# Patient Record
Sex: Female | Born: 1960 | Race: White | Hispanic: No | Marital: Single | Smoking: Current every day smoker
Health system: Southern US, Community
[De-identification: ages and names within clinical notes are randomized; demographics above are authoritative.]

## PROBLEM LIST (undated history)

## (undated) DIAGNOSIS — I10 Essential (primary) hypertension: Secondary | ICD-10-CM

## (undated) DIAGNOSIS — M419 Scoliosis, unspecified: Secondary | ICD-10-CM

---

## 2001-11-15 ENCOUNTER — Emergency Department (HOSPITAL_COMMUNITY): Admission: EM | Admit: 2001-11-15 | Discharge: 2001-11-15 | Payer: Self-pay | Admitting: Emergency Medicine

## 2003-03-02 ENCOUNTER — Emergency Department (HOSPITAL_COMMUNITY): Admission: EM | Admit: 2003-03-02 | Discharge: 2003-03-02 | Payer: Self-pay | Admitting: Unknown Physician Specialty

## 2003-04-05 ENCOUNTER — Ambulatory Visit (HOSPITAL_BASED_OUTPATIENT_CLINIC_OR_DEPARTMENT_OTHER): Admission: RE | Admit: 2003-04-05 | Discharge: 2003-04-06 | Payer: Self-pay | Admitting: Orthopedic Surgery

## 2004-09-16 ENCOUNTER — Emergency Department (HOSPITAL_COMMUNITY): Admission: EM | Admit: 2004-09-16 | Discharge: 2004-09-16 | Payer: Self-pay | Admitting: Family Medicine

## 2004-11-16 ENCOUNTER — Ambulatory Visit: Payer: Self-pay | Admitting: Family Medicine

## 2004-11-17 ENCOUNTER — Ambulatory Visit: Payer: Self-pay | Admitting: *Deleted

## 2004-12-11 ENCOUNTER — Ambulatory Visit: Payer: Self-pay | Admitting: Family Medicine

## 2005-02-23 ENCOUNTER — Ambulatory Visit: Payer: Self-pay | Admitting: Family Medicine

## 2005-03-05 ENCOUNTER — Emergency Department (HOSPITAL_COMMUNITY): Admission: EM | Admit: 2005-03-05 | Discharge: 2005-03-05 | Payer: Self-pay | Admitting: Family Medicine

## 2005-03-24 ENCOUNTER — Emergency Department (HOSPITAL_COMMUNITY): Admission: EM | Admit: 2005-03-24 | Discharge: 2005-03-24 | Payer: Self-pay | Admitting: Family Medicine

## 2005-03-25 ENCOUNTER — Ambulatory Visit (HOSPITAL_COMMUNITY): Admission: RE | Admit: 2005-03-25 | Discharge: 2005-03-25 | Payer: Self-pay | Admitting: Family Medicine

## 2005-07-01 ENCOUNTER — Emergency Department (HOSPITAL_COMMUNITY): Admission: EM | Admit: 2005-07-01 | Discharge: 2005-07-01 | Payer: Self-pay | Admitting: Emergency Medicine

## 2005-07-29 ENCOUNTER — Ambulatory Visit: Payer: Self-pay | Admitting: Family Medicine

## 2005-10-25 ENCOUNTER — Ambulatory Visit: Payer: Self-pay | Admitting: Family Medicine

## 2005-12-27 ENCOUNTER — Ambulatory Visit: Payer: Self-pay | Admitting: Family Medicine

## 2006-04-04 ENCOUNTER — Ambulatory Visit: Payer: Self-pay | Admitting: Family Medicine

## 2006-08-23 ENCOUNTER — Ambulatory Visit: Payer: Self-pay | Admitting: Internal Medicine

## 2006-10-14 ENCOUNTER — Ambulatory Visit (HOSPITAL_COMMUNITY): Admission: RE | Admit: 2006-10-14 | Discharge: 2006-10-14 | Payer: Self-pay | Admitting: Family Medicine

## 2006-11-02 ENCOUNTER — Ambulatory Visit: Payer: Self-pay | Admitting: Family Medicine

## 2006-12-19 ENCOUNTER — Emergency Department (HOSPITAL_COMMUNITY): Admission: EM | Admit: 2006-12-19 | Discharge: 2006-12-19 | Payer: Self-pay | Admitting: Emergency Medicine

## 2006-12-26 ENCOUNTER — Ambulatory Visit: Payer: Self-pay | Admitting: Family Medicine

## 2007-01-10 ENCOUNTER — Encounter (INDEPENDENT_AMBULATORY_CARE_PROVIDER_SITE_OTHER): Payer: Self-pay | Admitting: *Deleted

## 2007-01-10 ENCOUNTER — Ambulatory Visit: Payer: Self-pay | Admitting: Family Medicine

## 2007-06-07 ENCOUNTER — Encounter (INDEPENDENT_AMBULATORY_CARE_PROVIDER_SITE_OTHER): Payer: Self-pay | Admitting: *Deleted

## 2007-07-24 ENCOUNTER — Ambulatory Visit: Payer: Self-pay | Admitting: Internal Medicine

## 2007-08-08 ENCOUNTER — Ambulatory Visit: Payer: Self-pay | Admitting: Internal Medicine

## 2007-09-05 ENCOUNTER — Ambulatory Visit: Payer: Self-pay | Admitting: Internal Medicine

## 2007-09-05 ENCOUNTER — Encounter (INDEPENDENT_AMBULATORY_CARE_PROVIDER_SITE_OTHER): Payer: Self-pay | Admitting: Family Medicine

## 2007-09-05 LAB — CONVERTED CEMR LAB
ALT: 23 units/L (ref 0–35)
AST: 20 units/L (ref 0–37)
Albumin: 4.4 g/dL (ref 3.5–5.2)
Alkaline Phosphatase: 73 units/L (ref 39–117)
BUN: 10 mg/dL (ref 6–23)
Basophils Absolute: 0 10*3/uL (ref 0.0–0.1)
Basophils Relative: 1 % (ref 0–1)
CO2: 23 meq/L (ref 19–32)
Calcium: 9 mg/dL (ref 8.4–10.5)
Chloride: 109 meq/L (ref 96–112)
Cholesterol: 214 mg/dL — ABNORMAL HIGH (ref 0–200)
Creatinine, Ser: 0.54 mg/dL (ref 0.40–1.20)
Eosinophils Absolute: 0.2 10*3/uL (ref 0.2–0.7)
Eosinophils Relative: 4 % (ref 0–5)
Glucose, Bld: 102 mg/dL — ABNORMAL HIGH (ref 70–99)
HCT: 44.3 % (ref 36.0–46.0)
HDL: 68 mg/dL (ref >39)
Hemoglobin: 15.4 g/dL — ABNORMAL HIGH (ref 12.0–15.0)
LDL Cholesterol: 129 mg/dL — ABNORMAL HIGH (ref 0–99)
Lymphocytes Relative: 29 % (ref 12–46)
Lymphs Abs: 1.6 10*3/uL (ref 0.7–4.0)
MCHC: 34.8 g/dL (ref 30.0–36.0)
MCV: 94.3 fL (ref 78.0–100.0)
Monocytes Absolute: 0.4 10*3/uL (ref 0.1–1.0)
Monocytes Relative: 6 % (ref 3–12)
Neutro Abs: 3.5 10*3/uL (ref 1.7–7.7)
Neutrophils Relative %: 61 % (ref 43–77)
Platelets: 236 10*3/uL (ref 150–400)
Potassium: 4.2 meq/L (ref 3.5–5.3)
RBC: 4.7 M/uL (ref 3.87–5.11)
RDW: 12.7 % (ref 11.5–15.5)
Sodium: 142 meq/L (ref 135–145)
Total Bilirubin: 0.5 mg/dL (ref 0.3–1.2)
Total CHOL/HDL Ratio: 3.1
Total Protein: 7.2 g/dL (ref 6.0–8.3)
Triglycerides: 87 mg/dL (ref <150)
VLDL: 17 mg/dL (ref 0–40)
WBC: 5.7 10*3/uL (ref 4.0–10.5)

## 2007-09-19 ENCOUNTER — Ambulatory Visit: Payer: Self-pay | Admitting: Family Medicine

## 2007-10-20 ENCOUNTER — Ambulatory Visit (HOSPITAL_COMMUNITY): Admission: RE | Admit: 2007-10-20 | Discharge: 2007-10-20 | Payer: Self-pay | Admitting: Family Medicine

## 2008-03-13 ENCOUNTER — Ambulatory Visit: Payer: Self-pay | Admitting: Family Medicine

## 2008-03-13 ENCOUNTER — Encounter: Payer: Self-pay | Admitting: Family Medicine

## 2008-03-13 LAB — CONVERTED CEMR LAB
Chlamydia, DNA Probe: NEGATIVE
GC Probe Amp, Genital: NEGATIVE

## 2008-04-11 ENCOUNTER — Ambulatory Visit (HOSPITAL_COMMUNITY): Admission: RE | Admit: 2008-04-11 | Discharge: 2008-04-11 | Payer: Self-pay | Admitting: Family Medicine

## 2008-04-24 ENCOUNTER — Ambulatory Visit: Payer: Self-pay | Admitting: Family Medicine

## 2008-04-24 LAB — CONVERTED CEMR LAB
BUN: 9 mg/dL (ref 6–23)
CO2: 24 meq/L (ref 19–32)
Calcium: 9.1 mg/dL (ref 8.4–10.5)
Chloride: 105 meq/L (ref 96–112)
Cholesterol: 222 mg/dL — ABNORMAL HIGH (ref 0–200)
Creatinine, Ser: 0.61 mg/dL (ref 0.40–1.20)
FSH: 63.3 milliintl units/mL
Glucose, Bld: 99 mg/dL (ref 70–99)
HDL: 71 mg/dL (ref >39)
LDL Cholesterol: 135 mg/dL — ABNORMAL HIGH (ref 0–99)
Potassium: 4.6 meq/L (ref 3.5–5.3)
Sodium: 139 meq/L (ref 135–145)
TSH: 1.481 microintl units/mL (ref 0.350–4.50)
Total CHOL/HDL Ratio: 3.1
Triglycerides: 81 mg/dL (ref <150)
VLDL: 16 mg/dL (ref 0–40)

## 2009-01-30 ENCOUNTER — Emergency Department (HOSPITAL_COMMUNITY): Admission: EM | Admit: 2009-01-30 | Discharge: 2009-01-30 | Payer: Self-pay | Admitting: Family Medicine

## 2009-03-04 ENCOUNTER — Emergency Department (HOSPITAL_COMMUNITY): Admission: EM | Admit: 2009-03-04 | Discharge: 2009-03-04 | Payer: Self-pay | Admitting: Emergency Medicine

## 2009-04-24 ENCOUNTER — Emergency Department (HOSPITAL_COMMUNITY): Admission: EM | Admit: 2009-04-24 | Discharge: 2009-04-24 | Payer: Self-pay | Admitting: Emergency Medicine

## 2009-11-06 ENCOUNTER — Emergency Department (HOSPITAL_COMMUNITY): Admission: EM | Admit: 2009-11-06 | Discharge: 2009-11-06 | Payer: Self-pay | Admitting: Emergency Medicine

## 2009-11-18 ENCOUNTER — Ambulatory Visit: Payer: Self-pay | Admitting: Internal Medicine

## 2009-12-03 ENCOUNTER — Ambulatory Visit: Payer: Self-pay | Admitting: Internal Medicine

## 2010-01-07 ENCOUNTER — Ambulatory Visit: Payer: Self-pay | Admitting: Internal Medicine

## 2010-01-07 LAB — CONVERTED CEMR LAB
BUN: 8 mg/dL (ref 6–23)
Basophils Absolute: 0 10*3/uL (ref 0.0–0.1)
Basophils Relative: 1 % (ref 0–1)
CO2: 25 meq/L (ref 19–32)
Calcium: 9.6 mg/dL (ref 8.4–10.5)
Chloride: 99 meq/L (ref 96–112)
Cholesterol: 185 mg/dL (ref 0–200)
Creatinine, Ser: 0.52 mg/dL (ref 0.40–1.20)
Eosinophils Absolute: 0.3 10*3/uL (ref 0.0–0.7)
Eosinophils Relative: 5 % (ref 0–5)
Glucose, Bld: 84 mg/dL (ref 70–99)
HCT: 46.4 % — ABNORMAL HIGH (ref 36.0–46.0)
HDL: 39 mg/dL — ABNORMAL LOW (ref >39)
Hemoglobin: 16.2 g/dL — ABNORMAL HIGH (ref 12.0–15.0)
LDL Cholesterol: 111 mg/dL — ABNORMAL HIGH (ref 0–99)
Lymphocytes Relative: 34 % (ref 12–46)
Lymphs Abs: 2.2 10*3/uL (ref 0.7–4.0)
MCHC: 34.9 g/dL (ref 30.0–36.0)
MCV: 89.4 fL (ref 78.0–100.0)
Monocytes Absolute: 0.3 10*3/uL (ref 0.1–1.0)
Monocytes Relative: 5 % (ref 3–12)
Neutro Abs: 3.6 10*3/uL (ref 1.7–7.7)
Neutrophils Relative %: 56 % (ref 43–77)
Platelets: 314 10*3/uL (ref 150–400)
Potassium: 4 meq/L (ref 3.5–5.3)
RBC: 5.19 M/uL — ABNORMAL HIGH (ref 3.87–5.11)
RDW: 13.7 % (ref 11.5–15.5)
Sodium: 136 meq/L (ref 135–145)
Total CHOL/HDL Ratio: 4.7
Triglycerides: 174 mg/dL — ABNORMAL HIGH (ref <150)
VLDL: 35 mg/dL (ref 0–40)
WBC: 6.4 10*3/uL (ref 4.0–10.5)

## 2010-01-14 ENCOUNTER — Ambulatory Visit: Payer: Self-pay | Admitting: Internal Medicine

## 2010-03-05 ENCOUNTER — Ambulatory Visit: Payer: Self-pay | Admitting: Internal Medicine

## 2010-03-05 LAB — CONVERTED CEMR LAB
Chlamydia, Swab/Urine, PCR: NEGATIVE
GC Probe Amp, Urine: NEGATIVE

## 2010-06-25 ENCOUNTER — Emergency Department (HOSPITAL_COMMUNITY): Admission: EM | Admit: 2010-06-25 | Discharge: 2010-06-25 | Payer: Self-pay | Admitting: Emergency Medicine

## 2010-06-25 ENCOUNTER — Ambulatory Visit (HOSPITAL_COMMUNITY): Admission: RE | Admit: 2010-06-25 | Discharge: 2010-06-25 | Payer: Self-pay | Admitting: General Practice

## 2010-06-25 ENCOUNTER — Ambulatory Visit: Payer: Self-pay | Admitting: Surgery

## 2011-02-05 NOTE — Op Note (Signed)
NAME:  Cathy James, Cathy James                            ACCOUNT NO.:  [id number]   MEDICAL RECORD NO.:  [id number]                   PATIENT TYPE:  AMB   LOCATION:  DSC                                  FACILITY:  [facility]   PHYSICIAN:  Feliberto Gottron. Turner Daniels, M.D.                DATE OF BIRTH:  08/19/1961   DATE OF PROCEDURE:  04/05/2003  DATE OF DISCHARGE:  04/05/2003                                 OPERATIVE REPORT   PREOPERATIVE DIAGNOSES:  Anterior cruciate ligament tear and medial meniscal  tear of the left knee.   POSTOPERATIVE DIAGNOSES:  Anterior cruciate ligament tear and medial  meniscal tear of the left knee.   PROCEDURE:  Left knee arthroscopic partial medial meniscectomy with the  removal of the bucket handle tear of the medial meniscus, and an anterior  cruciate ligament tear reconstruction using a 10 mm wide bone-tendon-bone  allograft 8 x 20 femoral screw, 9 x 20 tibial screw, titanium screws from  [employer], profile type.   SURGEON:  Feliberto Gottron. Turner Daniels, M.D.   ASSISTANT:  Holly Bodily, P.A. student.   ANESTHESIA:  General, LMA.   ESTIMATED BLOOD LOSS:  <Minimal.   FLUIDS REPLACED:  1 L of crystalloid.   DRAINS:  None.   TOURNIQUET TIME:  None.   INDICATIONS FOR PROCEDURE:  The patient is a 50 year old woman who sustained  an ACL tear, a medial meniscal tear last year.  We treated her  conservatively with physical therapy and anti-inflammatory medicines;  unfortunately she continues to have buckling and pain.  This has gotten  worse recently, so she is taken for an ACL reconstruction, having failed  conservative measures, and a partial medial meniscectomy.   DESCRIPTION OF PROCEDURE:  The patient was identified by arm band and was  taken to the operating room at [facility]. [facility].  Appropriate anesthetic monitors were attached.  General LMA  anesthesia was induced with the patient in the supine position.  The  tourniquet was applied high on  the left thigh and never used.  The lateral  __________  foot position are applied to the table, and the left lower  extremity was prepped and draped in the usual sterile fashion from the ankle  to the tourniquet.  A standard inferomedial and inferolateral peri-patellar  portals were then made with a #11 blade, allowing introduction of the  arthroscope through the inferolateral portal and the outflow through the  inferomedial portal.  The suprapatellar pouch and patella were in excellent  condition.  On the medial side the patient had a displaced bucket handle  tear of the medial meniscus, and this was removed piece-meal with a #11  blade, the arthroscopic biters and a 4.2 great white sucker shaver.  In a  notch she had an ACL tear that looked to be chronic.  The stumps were  removed.  There  was a bear lateral wall.  The standard superolateral notch-  plasty was performed with a 4.5 hooded vortex bur.  The lateral compartment  was in excellent condition, and the meniscus was probed.  The knee was then  placed in the foot position,  at 45 degrees of flexion.  The [employer] tibial  pin placement guide set at 55 degrees.  A 2.0 cm incision was made 1.0 cm  medial to and just distal to the tibial tubercle.  The tibial pin placement  guide was set on the posterior aspect of the ACL foot print insertion.  A  pin was driven through the tibia 1.0 cm distal to medial to the tibial  tubercle, and up to the posterior aspect of the ACL foot print.  This was  over-reamed with a 10 mm Badger reamer with the knee held at 45 degrees.  We  then reamed the femoral tunnel using a 7 mm over-the-top guide for pin  placement, followed by the 10 mm reamer to a depth of about 35 mm.  Satisfied with the placement of the tunnels, a superior notch was placed in  the femoral tunnel and a lateral notch in the tibial tunnel.  On the back  table the allograft was prepared and was set at 10 mm wide bone-tendon-bone  graft.   A #20 gauge wire was placed through the tibial bone plug.  The graft  was then threaded through the tibial tunnel into the femoral tunnel and the  knee hyperflexed.  A supplemental inferomedial portal was made, allowing the  placement of an 8 x 20 [employer] titanium Propel screw, fixing the femoral  bone plug firmly in the tunnel.  The knee was taken through a range of  motion.  Isometry to within 1 mm was noted.  The knee was set at 45 degrees  of flexion.  Reverse Lachman's maneuver applied.  The graft itself was  twisted 90 degrees clockwise, and a 9 x 20 Propel screw was used to fix the  tibial bone plug in the tibial tunnel with traction applied to the graft.  The knee was then taken through a range of motion to confirm no impingement  of the graft in full extension and flexion, and good tension.  The #20 gauge  wire was then clipped and removed.  The knee was once again washed out with  normal saline solution and then the arthroscopic instruments were removed.  The wound for placement of the graft 2 cm in length was closed with running  #2-0 Vicryl sutures and running #3-0 nylon sutures.  The portals themselves  were left open.  A dressing of Xeroform, 4 x 4 dressings, sponges, Webril  and an Ace wrap applied.  The patient was then awakened and taken to the  recovery room without difficulty.                                                Feliberto Gottron. Turner Daniels, M.D.    Ovid Curd  D:  04/05/2003  T:  04/07/2003  Job:  [id number]

## 2011-04-20 ENCOUNTER — Inpatient Hospital Stay (INDEPENDENT_AMBULATORY_CARE_PROVIDER_SITE_OTHER)
Admission: RE | Admit: 2011-04-20 | Discharge: 2011-04-20 | Disposition: A | Discharge status: Home / Self Care | Payer: Self-pay | Source: Ambulatory Visit | Attending: Family Medicine | Admitting: Family Medicine

## 2011-04-20 DIAGNOSIS — R51 Headache: Secondary | ICD-10-CM

## 2011-04-20 DIAGNOSIS — R112 Nausea with vomiting, unspecified: Secondary | ICD-10-CM

## 2011-04-20 DIAGNOSIS — F411 Generalized anxiety disorder: Secondary | ICD-10-CM

## 2011-11-05 ENCOUNTER — Encounter (HOSPITAL_COMMUNITY): Payer: Self-pay | Admitting: Emergency Medicine

## 2011-11-05 ENCOUNTER — Emergency Department (INDEPENDENT_AMBULATORY_CARE_PROVIDER_SITE_OTHER)
Admission: EM | Admit: 2011-11-05 | Discharge: 2011-11-05 | Disposition: A | Discharge status: Home / Self Care | Payer: Self-pay | Source: Home / Self Care | Attending: Family Medicine | Admitting: Family Medicine

## 2011-11-05 DIAGNOSIS — A09 Infectious gastroenteritis and colitis, unspecified: Secondary | ICD-10-CM

## 2011-11-05 DIAGNOSIS — A084 Viral intestinal infection, unspecified: Secondary | ICD-10-CM

## 2011-11-05 HISTORY — DX: Essential (primary) hypertension: I10

## 2011-11-05 MED ORDER — ONDANSETRON 4 MG PO TBDP
4.0000 mg | ORAL_TABLET | Freq: Once | ORAL | Status: AC
  Administered 2011-11-05: 4 mg via ORAL

## 2011-11-05 MED ORDER — ONDANSETRON 4 MG PO TBDP
ORAL_TABLET | ORAL | Status: AC
  Filled 2011-11-05: qty 1

## 2011-11-05 MED ORDER — ONDANSETRON HCL 4 MG PO TABS: 4.0000 mg | ORAL_TABLET | Freq: Four times a day (QID) | ORAL | Status: AC

## 2011-11-05 NOTE — ED Provider Notes (Signed)
History     CSN: [id number]  Arrival date & time 11/05/11  1057   First MD Initiated Contact with Patient 11/05/11 1128      Chief Complaint  Patient presents with  . GI Problem  . URI    (Consider location/radiation/quality/duration/timing/severity/associated sxs/prior treatment) Patient is a 51 y.o. female presenting with GI illlness and URI. The history is provided by the patient.  GI Problem  This is a new problem. The current episode started 6 to 12 hours ago (n/v/d acute onset, similar sx in co-workers.). The problem occurs 2 to 4 times per day. The problem has been gradually improving. The stool consistency is described as watery. There has been no fever. Associated symptoms include vomiting, myalgias and URI. Risk factors include ill contacts.  URI The primary symptoms include nausea, vomiting and myalgias.    Past Medical History  Diagnosis Date  . Hypertension     History reviewed. No pertinent past surgical history.  No family history on file.  History  Substance Use Topics  . Smoking status: Current Everyday Smoker  . Smokeless tobacco: Not on file  . Alcohol Use: No    OB History    Grav Para Term Preterm Abortions TAB SAB Ect Mult Living                  Review of Systems  Constitutional: Negative.   Gastrointestinal: Positive for nausea, vomiting and diarrhea.  Musculoskeletal: Positive for myalgias.    Allergies  Review of patient's allergies indicates no known allergies.  Home Medications   Current Outpatient Rx  Name Route Sig Dispense Refill  . ONDANSETRON HCL 4 MG PO TABS Oral Take 1 tablet (4 mg total) by mouth every 6 (six) hours. 6 tablet 0    BP 145/90  Pulse 78  Temp(Src) 98.5 F (36.9 C) (Oral)  Resp 14  SpO2 98%  Physical Exam  Nursing note and vitals reviewed. Constitutional: She is oriented to person, place, and time. She appears well-developed and well-nourished.  HENT:  Head: Normocephalic.  Mouth/Throat:  Oropharynx is clear and moist.  Eyes: Pupils are equal, round, and reactive to light.  Neck: Normal range of motion. Neck supple.  Abdominal: Soft. Bowel sounds are normal. She exhibits no distension and no mass. There is no tenderness. There is no rebound and no guarding.  Lymphadenopathy:    She has no cervical adenopathy.  Neurological: She is alert and oriented to person, place, and time.  Skin: Skin is warm and dry.    ED Course  Procedures (including critical care time)  Labs Reviewed - No data to display No results found.   1. Gastroenteritis and colitis, viral       MDM         Barkley Bruns, MD 11/05/11 1201

## 2011-11-05 NOTE — ED Notes (Signed)
HERE WITH SUDDEN ONSET N/V/D THAT STARTED LAST NIGHT.LAST EMESIS THIS AM AND NOW HAVING BODY ACHES AND CHILLS.

## 2012-11-13 ENCOUNTER — Encounter (HOSPITAL_COMMUNITY): Payer: Self-pay

## 2012-11-13 ENCOUNTER — Emergency Department (INDEPENDENT_AMBULATORY_CARE_PROVIDER_SITE_OTHER)
Admission: EM | Admit: 2012-11-13 | Discharge: 2012-11-13 | Disposition: A | Discharge status: Home / Self Care | Payer: Self-pay | Source: Home / Self Care | Attending: Emergency Medicine | Admitting: Emergency Medicine

## 2012-11-13 HISTORY — DX: Scoliosis, unspecified: M41.9

## 2012-11-13 MED ORDER — MELOXICAM 7.5 MG PO TABS: 7.5000 mg | ORAL_TABLET | Freq: Every day | ORAL | Status: DC

## 2012-11-13 NOTE — ED Notes (Signed)
No known injury; has been having issues with her right arm for several months; pain , episodic numbness; reportedly has been to see MD about her back (scolosis ) and has been using left over hydrocodone for her syx as needed; works as Advertising copywriter

## 2012-11-13 NOTE — ED Provider Notes (Signed)
History     CSN: [id number]  Arrival date & time 11/13/12  1000   First MD Initiated Contact with Patient 11/13/12 1015      Chief Complaint  Patient presents with  . Arm Pain    (Consider location/radiation/quality/duration/timing/severity/associated sxs/prior treatment) HPI Comments: Patient presents urgent care this morning describing the for several months she's been having right upper arm pain that shoots down sometimes reaching to move her fingers and with intermittent tingling and numbness sensation location. Denies any neck pain or upper back pain. Denies any recent or remote trauma or injuries. 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 (Patient points to anterior aspect of right upper arm bicipital region). No redness, no chills or fevers, no soft tissue swelling. She has taken some leftovers hydrocodone have been prescribed to her for scoliosis or back pain.  Patient is a 52 y.o. female presenting with arm pain. The history is provided by the patient.  Arm Pain This is a recurrent problem. The current episode started more than 1 week ago. The problem occurs constantly. The problem has been gradually worsening. Nothing aggravates the symptoms. Nothing relieves the symptoms. Treatments tried: Hydrocodone. The treatment provided moderate relief.    Past Medical History  Diagnosis Date  . Hypertension   . Scoliosis     History reviewed. No pertinent past surgical history.  History reviewed. No pertinent family history.  History  Substance Use Topics  . Smoking status: Current Every Day Smoker  . Smokeless tobacco: Not on file  . Alcohol Use: No    OB History   Grav Para Term Preterm Abortions TAB SAB Ect Mult Living                  Review of Systems  Constitutional: Positive for activity change. Negative for fever, diaphoresis, appetite change, fatigue and unexpected weight change.  Musculoskeletal:  Negative for myalgias.  Skin: Negative for color change, pallor, rash and wound.  Neurological: Negative for weakness and numbness.    Allergies  Review of patient's allergies indicates no known allergies.  Home Medications   Current Outpatient Rx  Name  Route  Sig  Dispense  Refill  . meloxicam (MOBIC) 7.5 MG tablet   Oral   Take 1 tablet (7.5 mg total) by mouth daily. Take one tablet daily for 2 weeks   14 tablet   0     BP 130/89  Pulse 83  Temp(Src) 98.1 F (36.7 C) (Oral)  Resp 18  SpO2 98%  Physical Exam  Nursing note and vitals reviewed. Constitutional: She appears well-developed and well-nourished.  Neck: Normal range of motion. Neck supple.  Musculoskeletal: She exhibits tenderness. She exhibits no edema.       Right upper arm: She exhibits tenderness. She exhibits no bony tenderness, no swelling, no edema, no deformity and no laceration.       Arms: Neurological: She is alert.  Skin: No rash noted. No erythema.    ED Course  Procedures (including critical care time)  Labs Reviewed - No data to display No results found.   1. Biceps tendinitis on right       MDM  Symptoms and physical exam consistent with bicipital tendinitis. Patient has been encouraged to use an Ace wrap or a total sleep to be used in this biceps region. Have, prescribe patient and meloxicam course for 2 weeks encouraged her to use some degree of compression to the area  to follow orthopedic provider if pain was to persist beyond 2 weeks.        Jimmie Molly, MD 11/13/12 [phone]

## 2012-11-25 ENCOUNTER — Encounter (HOSPITAL_COMMUNITY): Payer: Self-pay | Admitting: Emergency Medicine

## 2012-11-25 ENCOUNTER — Emergency Department (HOSPITAL_COMMUNITY)
Admission: EM | Admit: 2012-11-25 | Discharge: 2012-11-25 | Disposition: A | Discharge status: Home / Self Care | Payer: Self-pay | Source: Home / Self Care

## 2012-11-25 DIAGNOSIS — W19XXXA Unspecified fall, initial encounter: Secondary | ICD-10-CM

## 2012-11-25 DIAGNOSIS — S300XXA Contusion of lower back and pelvis, initial encounter: Secondary | ICD-10-CM

## 2012-11-25 NOTE — ED Notes (Signed)
Reports falling yesterday morning, slipped on ic .  Reports back pain, not as bad as it was yesterday.  Has been using heating pad and ibuprofen

## 2012-11-25 NOTE — ED Provider Notes (Signed)
History     CSN: [id number]  Arrival date & time 11/25/12  1102   None     Chief Complaint  Patient presents with  . 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 She experienced mild discomfort in most but one of the employees saw her fall and insisted that she be checked out prior to returning to work at 100%. The patient states she has no other injuries and that she only has mild soreness in the right buttock. She denies limitations with ambulation or any other movements. Denies injury to the head, neck, chest, back, abdomen, or lower extremities.   Past Medical History  Diagnosis Date  . Hypertension   . Scoliosis     History reviewed. No pertinent past surgical history.  No family history on file.  History  Substance Use Topics  . Smoking status: Current Every Day Smoker  . Smokeless tobacco: Not on file  . Alcohol Use: No    OB History   Grav Para Term Preterm Abortions TAB SAB Ect Mult Living                  Review of Systems  Constitutional: Negative for fever, chills and activity change.  HENT: Negative.   Respiratory: Negative.   Cardiovascular: Negative.   Musculoskeletal:       As per HPI  Skin: Negative for color change, pallor and rash.  Neurological: Negative.     Allergies  Review of patient's allergies indicates no known allergies.  Home Medications   Current Outpatient Rx  Name  Route  Sig  Dispense  Refill  . ibuprofen (ADVIL,MOTRIN) 200 MG tablet   Oral   Take 200 mg by mouth every 6 (six) hours as needed for pain.         . meloxicam (MOBIC) 7.5 MG tablet   Oral   Take 1 tablet (7.5 mg total) by mouth daily. Take one tablet daily for 2 weeks   14 tablet   0     BP 144/92  Pulse 86  Temp(Src) 98.7 F (37.1 C) (Oral)  Resp 20  SpO2 97%  Physical Exam   Constitutional: She is oriented to person, place, and time. She appears well-developed and well-nourished. No distress.  HENT:  Head: Normocephalic and atraumatic.  Eyes: EOM are normal. Pupils are equal, round, and reactive to light.  Neck: Normal range of motion. Neck supple.  Cardiovascular: Normal rate.   Pulmonary/Chest: Effort normal.  Abdominal: Soft.  Musculoskeletal:  Patient demonstrates full range of motion of the lower extremities. There is no bony tenderness or asymmetry. She is able to squat completely and then lift herself back up. Minimal tenderness to the right posterior buttock. No swelling. The gait is normal number no limitations in her movements. The back is nontender and nonpainful. She is able to bend forward beyond 90 without restrictions the   Lymphadenopathy:    She has no cervical adenopathy.  Neurological: She is alert and oriented to person, place, and time. No cranial nerve deficit.  Skin: Skin is warm and dry.  Psychiatric: She has a normal mood and affect.    ED Course  Procedures (including critical care time)  Labs Reviewed - No data to display No results found.   1. Contusion, buttock, initial encounter   2. Fall       MDM  Patient has no signs  of injury at this time. There is minor soreness to the right posterior buttock. She may return to work in 2 days at 100% capacity. In the meantime she may apply ice to the buttock for any soreness or intake Aleve or ibuprofen if needed.        Hayden Rasmussen, NP 11/25/12 [phone]

## 2012-11-26 NOTE — ED Provider Notes (Signed)
  I have directly reviewed the clinical findings, lab, imaging studies and management of this patient in detail. I have interviewed and examined the patient and agree with the documentation,  as recorded by the Physician extender.  Leroy Sea M.D on 11/26/2012 at 12:56 PM  [facility] Office  [phone]

## 2015-03-12 ENCOUNTER — Ambulatory Visit: Payer: Self-pay | Attending: Internal Medicine

## 2015-04-07 ENCOUNTER — Encounter: Payer: Self-pay | Admitting: Family Medicine

## 2015-04-07 ENCOUNTER — Ambulatory Visit: Payer: Self-pay | Attending: Family Medicine | Admitting: Family Medicine

## 2015-04-07 VITALS — BP 134/79 | HR 78 | Temp 99.1°F | Resp 16 | Ht 62.0 in | Wt 170.0 lb

## 2015-04-07 DIAGNOSIS — F17200 Nicotine dependence, unspecified, uncomplicated: Secondary | ICD-10-CM | POA: Insufficient documentation

## 2015-04-07 DIAGNOSIS — M25512 Pain in left shoulder: Secondary | ICD-10-CM

## 2015-04-07 DIAGNOSIS — G8929 Other chronic pain: Secondary | ICD-10-CM | POA: Insufficient documentation

## 2015-04-07 DIAGNOSIS — F329 Major depressive disorder, single episode, unspecified: Secondary | ICD-10-CM

## 2015-04-07 DIAGNOSIS — I10 Essential (primary) hypertension: Secondary | ICD-10-CM

## 2015-04-07 DIAGNOSIS — M545 Low back pain, unspecified: Secondary | ICD-10-CM | POA: Insufficient documentation

## 2015-04-07 DIAGNOSIS — M25511 Pain in right shoulder: Secondary | ICD-10-CM

## 2015-04-07 DIAGNOSIS — F32A Depression, unspecified: Secondary | ICD-10-CM

## 2015-04-07 DIAGNOSIS — E119 Type 2 diabetes mellitus without complications: Secondary | ICD-10-CM | POA: Insufficient documentation

## 2015-04-07 DIAGNOSIS — Z0000 Encounter for general adult medical examination without abnormal findings: Secondary | ICD-10-CM

## 2015-04-07 LAB — COMPLETE METABOLIC PANEL WITH GFR
ALT: 13 U/L (ref 0–35)
AST: 19 U/L (ref 0–37)
Albumin: 3.8 g/dL (ref 3.5–5.2)
Alkaline Phosphatase: 82 U/L (ref 39–117)
BILIRUBIN TOTAL: 0.4 mg/dL (ref 0.2–1.2)
BUN: 10 mg/dL (ref 6–23)
CO2: 30 mEq/L (ref 19–32)
Calcium: 10 mg/dL (ref 8.4–10.5)
Chloride: 104 mEq/L (ref 96–112)
Creat: 0.53 mg/dL (ref 0.50–1.10)
GFR, Est African American: >89 mL/min
GLUCOSE: 96 mg/dL (ref 70–99)
Potassium: 4.4 mEq/L (ref 3.5–5.3)
Sodium: 142 mEq/L (ref 135–145)
Total Protein: 6.5 g/dL (ref 6.0–8.3)

## 2015-04-07 LAB — CBC
HCT: 41.1 % (ref 36.0–46.0)
Hemoglobin: 14.1 g/dL (ref 12.0–15.0)
MCH: 30.9 pg (ref 26.0–34.0)
MCHC: 34.3 g/dL (ref 30.0–36.0)
MCV: 89.9 fL (ref 78.0–100.0)
MPV: 11.3 fL (ref 8.6–12.4)
Platelets: 259 10*3/uL (ref 150–400)
RBC: 4.57 MIL/uL (ref 3.87–5.11)
RDW: 13.8 % (ref 11.5–15.5)
WBC: 7.9 10*3/uL (ref 4.0–10.5)

## 2015-04-07 LAB — VITAMIN B12: Vitamin B-12: 1324 pg/mL — ABNORMAL HIGH (ref 211–911)

## 2015-04-07 MED ORDER — PAROXETINE HCL 20 MG PO TABS: 20.0000 mg | ORAL_TABLET | Freq: Every day | ORAL | Status: DC

## 2015-04-07 MED ORDER — NABUMETONE 500 MG PO TABS: 500.0000 mg | ORAL_TABLET | Freq: Every day | ORAL | Status: DC

## 2015-04-07 MED ORDER — ACETAMINOPHEN-CODEINE #3 300-30 MG PO TABS: 1.0000 | ORAL_TABLET | Freq: Three times a day (TID) | ORAL | Status: DC | PRN

## 2015-04-07 MED ORDER — HYDROCHLOROTHIAZIDE 12.5 MG PO CAPS: 12.5000 mg | ORAL_CAPSULE | Freq: Every day | ORAL | Status: DC

## 2015-04-07 NOTE — Progress Notes (Signed)
Establish care Complaining of shoulder pain, tingling on lt shoulder arm and hand  Hip and back pain  Hx of back surgery  Ht Tobacco less then one pack per day

## 2015-04-07 NOTE — Assessment & Plan Note (Signed)
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

## 2015-04-07 NOTE — Progress Notes (Signed)
   Subjective:    Patient ID: Cathy James, female    DOB: 01/30/1961, 54 y.o.   MRN: [id number] CC: establish care, chronic low back pain, b/l shoulder pain with radiculopathy down L arm  HPI 54 yo F presents to establish care and discuss the following:  1. Chronic low back pain: since 2005. 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  Had low back surgery in [place] in 11/2013, L4 and L5. Has been released fromr care. Has returned to work last month. Doing on laundry. Has low back pain that radiates to R lateral gluteal pain and sensation of swelling and hurts. Pain radiates down anterior thigh to knee.   2. Shoulder pain: b/l. Since 12/2014.  Worse on L side with tingling down L arm to hand. Has associated popping. Rubbing icy hot twice daily. Superior shoulder pain. Radiates medially toward neck and down cervical neck. 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   Soc Hx: current smoker  Med Hx: HTN and DM2 Surg Hx: low back surgery  Review of Systems  Constitutional: Negative for fever and chills.  Respiratory: Negative for shortness of breath.   Cardiovascular: Negative for chest pain.  Gastrointestinal: Negative for abdominal pain and blood in stool.  Musculoskeletal: Positive for back pain, arthralgias and neck pain.  Skin: Negative for rash.  Psychiatric/Behavioral: Negative for suicidal ideas and dysphoric mood.     GAD-7: score of 20. 3s to all except 2-7.     Objective:   Physical Exam BP 134/79 mmHg  Pulse 78  Temp(Src) 99.1 F (37.3 C) (Oral)  Resp 16  Ht 5\' 2"  (1.575 m)  Wt 170 lb (77.111 kg)  BMI 31.09 kg/m2  SpO2 96% General appearance: alert, cooperative and no distress Neck: no adenopathy, supple, symmetrical, trachea midline and thyroid not enlarged, symmetric, no tenderness/mass/nodules, normal ROM, + Spurling on L side  Lungs: normal WOB, exp wheezing b/l, no crackles  Heart: regular rate and rhythm, S1,  S2 normal, no murmur, click, rub or gallop Extremities: extremities normal, atraumatic, no cyanosis or edema Pulses: 2+ and symmetric Skin: Skin color, texture, turgor normal. No rashes or lesions Back: midline low back pain, + FABER on R side, TTP R lateral gluteal  Neuro: 5/5 LE strength and normal reflexes b/l      Assessment & Plan:

## 2015-04-07 NOTE — Assessment & Plan Note (Signed)
A: low back pain with sciatica s/p lumbar back surgery P:  Patient signed for records, MRI report, op note Tylenol #3 for pain Nabumetone for inflammation

## 2015-04-07 NOTE — Assessment & Plan Note (Signed)
HTN:  BP at goal HCTZ refilled

## 2015-04-07 NOTE — Assessment & Plan Note (Signed)
Depression: Refilled paxil  [facility]

## 2015-04-07 NOTE — Patient Instructions (Signed)
Mrs. Cathy James,  Thank you for coming in today. It was a pleasure meeting you. I look forward to being your primary doctor.  1. 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 HTN:  BP at goal HCTZ refilled  3. Depression: Follow up with [facility] paxil refilled  You will be called with x-ray and lab results  F/u in 6 weeks for pap smear  Dr. Armen PickupFunches

## 2015-04-08 LAB — VITAMIN D 25 HYDROXY (VIT D DEFICIENCY, FRACTURES): VIT D 25 HYDROXY: 37 ng/mL (ref 30–100)

## 2015-04-09 ENCOUNTER — Telehealth: Payer: Self-pay | Admitting: *Deleted

## 2015-04-09 NOTE — Telephone Encounter (Signed)
Unable to contact Pt Phone unavailable at this time  communication letter send

## 2015-04-09 NOTE — Telephone Encounter (Signed)
-----   Message from Dessa PhiJosalyn Funches, MD sent at 04/08/2015 10:13 AM EDT ----- All labs normal

## 2015-04-14 ENCOUNTER — Emergency Department (HOSPITAL_COMMUNITY): Admission: EM | Admit: 2015-04-14 | Discharge: 2015-04-14 | Discharge status: Absent without leave | Payer: Self-pay

## 2015-04-14 ENCOUNTER — Encounter (HOSPITAL_COMMUNITY): Payer: Self-pay

## 2015-04-14 ENCOUNTER — Ambulatory Visit (HOSPITAL_COMMUNITY)
Admission: RE | Admit: 2015-04-14 | Discharge: 2015-04-14 | Disposition: A | Discharge status: Home / Self Care | Payer: Self-pay | Source: Ambulatory Visit | Attending: Family Medicine | Admitting: Family Medicine

## 2015-04-14 DIAGNOSIS — M25511 Pain in right shoulder: Secondary | ICD-10-CM

## 2015-04-14 DIAGNOSIS — M5032 Other cervical disc degeneration, mid-cervical region: Secondary | ICD-10-CM | POA: Insufficient documentation

## 2015-04-14 DIAGNOSIS — M25512 Pain in left shoulder: Secondary | ICD-10-CM

## 2015-04-17 ENCOUNTER — Telehealth: Payer: Self-pay | Admitting: Family Medicine

## 2015-04-17 NOTE — Telephone Encounter (Signed)
Patient returned phone call from nurse, please f/u at new number.

## 2015-04-22 NOTE — Telephone Encounter (Signed)
Returned pt call, Unable to contact pt    *All labs normal

## 2015-04-22 NOTE — Telephone Encounter (Signed)
Pt. Is returning call from nurse , please call patient °

## 2015-04-30 NOTE — Telephone Encounter (Signed)
Pt aware of results  Stated Tylenol #3 not helping for pain

## 2015-04-30 NOTE — Telephone Encounter (Signed)
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

## 2015-05-01 MED ORDER — TRAMADOL HCL 50 MG PO TABS: 50.0000 mg | ORAL_TABLET | Freq: Three times a day (TID) | ORAL | Status: DC | PRN

## 2015-05-01 NOTE — Telephone Encounter (Signed)
Pain med changed to tramadol Rx placed up front for pick up

## 2015-05-15 NOTE — Telephone Encounter (Signed)
Called Pt, verified Date of birth Pt advised Rx Tramadol at front office

## 2015-06-02 ENCOUNTER — Other Ambulatory Visit: Payer: Self-pay | Admitting: Family Medicine

## 2015-06-11 ENCOUNTER — Ambulatory Visit: Payer: Self-pay | Attending: Family Medicine | Admitting: Family Medicine

## 2015-06-11 ENCOUNTER — Encounter: Payer: Self-pay | Admitting: Family Medicine

## 2015-06-11 VITALS — BP 146/85 | HR 73 | Temp 98.6°F | Resp 16 | Ht 62.0 in | Wt 161.0 lb

## 2015-06-11 DIAGNOSIS — M25551 Pain in right hip: Secondary | ICD-10-CM | POA: Insufficient documentation

## 2015-06-11 DIAGNOSIS — Z23 Encounter for immunization: Secondary | ICD-10-CM

## 2015-06-11 DIAGNOSIS — M25512 Pain in left shoulder: Secondary | ICD-10-CM | POA: Insufficient documentation

## 2015-06-11 DIAGNOSIS — M25511 Pain in right shoulder: Secondary | ICD-10-CM | POA: Insufficient documentation

## 2015-06-11 DIAGNOSIS — Z0000 Encounter for general adult medical examination without abnormal findings: Secondary | ICD-10-CM

## 2015-06-11 DIAGNOSIS — L309 Dermatitis, unspecified: Secondary | ICD-10-CM | POA: Insufficient documentation

## 2015-06-11 DIAGNOSIS — M545 Low back pain: Secondary | ICD-10-CM | POA: Insufficient documentation

## 2015-06-11 MED ORDER — FLUOCINONIDE 0.05 % EX SOLN: 1.0000 "application " | Freq: Two times a day (BID) | CUTANEOUS | Status: DC

## 2015-06-11 MED ORDER — TRAMADOL HCL 50 MG PO TABS: 50.0000 mg | ORAL_TABLET | Freq: Three times a day (TID) | ORAL | Status: DC | PRN

## 2015-06-11 MED ORDER — METHOCARBAMOL 750 MG PO TABS: 750.0000 mg | ORAL_TABLET | Freq: Three times a day (TID) | ORAL | Status: DC | PRN

## 2015-06-11 MED ORDER — NABUMETONE 500 MG PO TABS: 500.0000 mg | ORAL_TABLET | Freq: Every day | ORAL | Status: DC

## 2015-06-11 MED ORDER — TRIAMCINOLONE ACETONIDE 0.1 % EX CREA: 1.0000 "application " | TOPICAL_CREAM | Freq: Two times a day (BID) | CUTANEOUS | Status: DC

## 2015-06-11 MED ORDER — GABAPENTIN 300 MG PO CAPS: 600.0000 mg | ORAL_CAPSULE | Freq: Three times a day (TID) | ORAL | Status: DC

## 2015-06-11 MED ORDER — ACETAMINOPHEN-CODEINE #3 300-30 MG PO TABS: 1.0000 | ORAL_TABLET | Freq: Three times a day (TID) | ORAL | Status: DC | PRN

## 2015-06-11 NOTE — Progress Notes (Signed)
Subjective:    Patient ID: Cathy James, female    DOB: [birth year], 54 y.o.   MRN: [id number] CC: establish care, chronic low back pain, b/l shoulder pain with radiculopathy down L arm  HPI 54 yo F presents for f/u chronic pain,   Since last OV she has been taking tylenol #3, tramadol and nabumetone for the pain which helps with symptoms.   1. Chronic low back pain: since 2005. 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 Went to PT, tried medications.  Had low back surgery in [place] in 11/2013, L4 and L5. Has been released fromr care. Has returned to work last month. Doing on laundry. Has low back pain that radiates to R lateral gluteal pain and sensation of swelling and hurts. Pain radiates down anterior thigh to knee.   2. Shoulder pain: b/l. Since 12/2014.  Worse on L side with tingling down L arm to hand. Has associated popping. Rubbing icy hot twice daily. Superior shoulder pain. Radiates medially toward neck and down cervical neck. 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    3. R hip pain: chronic pain. Worsening. Worse with hip flexion and external rotation. Associated with stiffness and gait disturbance.   Social History  Substance Use Topics  . Smoking status: Current Every Day Smoker  . Smokeless tobacco: Not on file  . Alcohol Use: No    Review of Systems  Constitutional: Negative for fever and chills.  Respiratory: Negative for shortness of breath.   Cardiovascular: Negative for chest pain.  Gastrointestinal: Negative for abdominal pain and blood in stool.  Musculoskeletal: Positive for back pain, arthralgias and neck pain.  Skin: Negative for rash.  Psychiatric/Behavioral: Negative for suicidal ideas and dysphoric mood.       Objective:   Physical Exam  Constitutional: She is oriented to person, place, and time. She appears well-developed and well-nourished. No distress.  HENT:  Head: Normocephalic and atraumatic.  Cardiovascular: Normal  rate, regular rhythm, normal heart sounds and intact distal pulses.   Pulmonary/Chest: Effort normal and breath sounds normal.  Musculoskeletal: She exhibits no edema.  Neurological: She is alert and oriented to person, place, and time. Gait abnormal.  Skin: Skin is warm and dry. No rash noted.  Psychiatric: She has a normal mood and affect.       Assessment & Plan:  Diagnoses and all orders for this visit:  Midline low back pain, with sciatica presence unspecified -     nabumetone (RELAFEN) 500 MG tablet; Take 1-2 tablets (500-1,000 mg total) by mouth daily. -     traMADol (ULTRAM) 50 MG tablet; Take 1 tablet (50 mg total) by mouth every 8 (eight) hours as needed. -     acetaminophen-codeine (TYLENOL #3) 300-30 MG per tablet; Take 1 tablet by mouth every 8 (eight) hours as needed for moderate pain. -     gabapentin (NEURONTIN) 300 MG capsule; Take 2 capsules (600 mg total) by mouth 3 (three) times daily. -     methocarbamol (ROBAXIN) 750 MG tablet; Take 1 tablet (750 mg total) by mouth every 8 (eight) hours as needed for muscle spasms.  Right hip pain -     nabumetone (RELAFEN) 500 MG tablet; Take 1-2 tablets (500-1,000 mg total) by mouth daily. -     traMADol (ULTRAM) 50 MG tablet; Take 1 tablet (50 mg total) by mouth every 8 (eight) hours as needed. -     acetaminophen-codeine (TYLENOL #3)  300-30 MG per tablet; Take 1 tablet by mouth every 8 (eight) hours as needed for moderate pain. -     gabapentin (NEURONTIN) 300 MG capsule; Take 2 capsules (600 mg total) by mouth 3 (three) times daily. -     methocarbamol (ROBAXIN) 750 MG tablet; Take 1 tablet (750 mg total) by mouth every 8 (eight) hours as needed for muscle spasms.  Bilateral shoulder pain -     nabumetone (RELAFEN) 500 MG tablet; Take 1-2 tablets (500-1,000 mg total) by mouth daily. -     traMADol (ULTRAM) 50 MG tablet; Take 1 tablet (50 mg total) by mouth every 8 (eight) hours as needed. -     acetaminophen-codeine (TYLENOL  #3) 300-30 MG per tablet; Take 1 tablet by mouth every 8 (eight) hours as needed for moderate pain. -     gabapentin (NEURONTIN) 300 MG capsule; Take 2 capsules (600 mg total) by mouth 3 (three) times daily. -     methocarbamol (ROBAXIN) 750 MG tablet; Take 1 tablet (750 mg total) by mouth every 8 (eight) hours as needed for muscle spasms.  Healthcare maintenance -     Flu Vaccine QUAD 36+ mos IM  Eczema -     fluocinonide (LIDEX) 0.05 % external solution; Apply 1 application topically 2 (two) times daily. -     triamcinolone cream (KENALOG) 0.1 %; Apply 1 application topically 2 (two) times daily.

## 2015-06-11 NOTE — Progress Notes (Signed)
F/U pain on neck, shoulder,  back and Hip Pain scale #4 Pain with movement  Hx tobacco 15 cigarette per day

## 2015-06-11 NOTE — Patient Instructions (Addendum)
Cathy James,   Thank you for coming in today. I refilled your medications.  I will get records from your spine specialist.  F/u with me in 4-6 weeks for pap smear, please schedule your mammogram  Dr. Armen Pickup  Diagnoses and all orders for this visit:  Encounter for immunization  Midline low back pain, with sciatica presence unspecified -     nabumetone (RELAFEN) 500 MG tablet; Take 1-2 tablets (500-1,000 mg total) by mouth daily. -     traMADol (ULTRAM) 50 MG tablet; Take 1 tablet (50 mg total) by mouth every 8 (eight) hours as needed. -     acetaminophen-codeine (TYLENOL #3) 300-30 MG per tablet; Take 1 tablet by mouth every 8 (eight) hours as needed for moderate pain. -     gabapentin (NEURONTIN) 300 MG capsule; Take 2 capsules (600 mg total) by mouth 3 (three) times daily. -     methocarbamol (ROBAXIN) 750 MG tablet; Take 1 tablet (750 mg total) by mouth every 8 (eight) hours as needed for muscle spasms.  Right hip pain -     nabumetone (RELAFEN) 500 MG tablet; Take 1-2 tablets (500-1,000 mg total) by mouth daily. -     traMADol (ULTRAM) 50 MG tablet; Take 1 tablet (50 mg total) by mouth every 8 (eight) hours as needed. -     acetaminophen-codeine (TYLENOL #3) 300-30 MG per tablet; Take 1 tablet by mouth every 8 (eight) hours as needed for moderate pain. -     gabapentin (NEURONTIN) 300 MG capsule; Take 2 capsules (600 mg total) by mouth 3 (three) times daily. -     methocarbamol (ROBAXIN) 750 MG tablet; Take 1 tablet (750 mg total) by mouth every 8 (eight) hours as needed for muscle spasms.  Bilateral shoulder pain -     nabumetone (RELAFEN) 500 MG tablet; Take 1-2 tablets (500-1,000 mg total) by mouth daily. -     traMADol (ULTRAM) 50 MG tablet; Take 1 tablet (50 mg total) by mouth every 8 (eight) hours as needed. -     acetaminophen-codeine (TYLENOL #3) 300-30 MG per tablet; Take 1 tablet by mouth every 8 (eight) hours as needed for moderate pain. -     gabapentin (NEURONTIN) 300 MG  capsule; Take 2 capsules (600 mg total) by mouth 3 (three) times daily. -     methocarbamol (ROBAXIN) 750 MG tablet; Take 1 tablet (750 mg total) by mouth every 8 (eight) hours as needed for muscle spasms.  Healthcare maintenance  Eczema -     fluocinonide (LIDEX) 0.05 % external solution; Apply 1 application topically 2 (two) times daily. -     triamcinolone cream (KENALOG) 0.1 %; Apply 1 application topically 2 (two) times daily.  Other orders -     Flu Vaccine QUAD 36+ mos IM

## 2015-07-17 ENCOUNTER — Ambulatory Visit: Payer: Self-pay | Admitting: Family Medicine

## 2015-08-28 ENCOUNTER — Other Ambulatory Visit: Payer: Self-pay | Admitting: Family Medicine

## 2015-09-04 NOTE — Telephone Encounter (Signed)
Patient called and requested a med refill for acetaminophen-codeine (TYLENOL #3) 300-30 MG per tablet and traMADol (ULTRAM) 50 MG tablet. Please f/u with pt.

## 2015-09-11 ENCOUNTER — Ambulatory Visit: Payer: Self-pay | Attending: Family Medicine | Admitting: Family Medicine

## 2015-09-11 ENCOUNTER — Encounter: Payer: Self-pay | Admitting: Family Medicine

## 2015-09-11 VITALS — BP 161/95 | HR 77 | Temp 98.8°F | Resp 16 | Ht 62.0 in | Wt 154.0 lb

## 2015-09-11 DIAGNOSIS — F329 Major depressive disorder, single episode, unspecified: Secondary | ICD-10-CM | POA: Insufficient documentation

## 2015-09-11 DIAGNOSIS — R5383 Other fatigue: Secondary | ICD-10-CM | POA: Insufficient documentation

## 2015-09-11 DIAGNOSIS — Z720 Tobacco use: Secondary | ICD-10-CM

## 2015-09-11 DIAGNOSIS — G894 Chronic pain syndrome: Secondary | ICD-10-CM | POA: Insufficient documentation

## 2015-09-11 DIAGNOSIS — M25512 Pain in left shoulder: Secondary | ICD-10-CM | POA: Insufficient documentation

## 2015-09-11 DIAGNOSIS — Z79899 Other long term (current) drug therapy: Secondary | ICD-10-CM | POA: Insufficient documentation

## 2015-09-11 DIAGNOSIS — M545 Low back pain: Secondary | ICD-10-CM | POA: Insufficient documentation

## 2015-09-11 DIAGNOSIS — R5382 Chronic fatigue, unspecified: Secondary | ICD-10-CM | POA: Insufficient documentation

## 2015-09-11 DIAGNOSIS — N950 Postmenopausal bleeding: Secondary | ICD-10-CM | POA: Insufficient documentation

## 2015-09-11 DIAGNOSIS — I10 Essential (primary) hypertension: Secondary | ICD-10-CM | POA: Insufficient documentation

## 2015-09-11 DIAGNOSIS — F17200 Nicotine dependence, unspecified, uncomplicated: Secondary | ICD-10-CM | POA: Insufficient documentation

## 2015-09-11 DIAGNOSIS — M25551 Pain in right hip: Secondary | ICD-10-CM | POA: Insufficient documentation

## 2015-09-11 DIAGNOSIS — F32A Depression, unspecified: Secondary | ICD-10-CM

## 2015-09-11 DIAGNOSIS — M25511 Pain in right shoulder: Secondary | ICD-10-CM | POA: Insufficient documentation

## 2015-09-11 LAB — CBC
HEMATOCRIT: 41.4 % (ref 36.0–46.0)
HEMOGLOBIN: 14.1 g/dL (ref 12.0–15.0)
MCH: 30.1 pg (ref 26.0–34.0)
MCHC: 34.1 g/dL (ref 30.0–36.0)
MCV: 88.5 fL (ref 78.0–100.0)
MPV: 11.4 fL (ref 8.6–12.4)
Platelets: 285 10*3/uL (ref 150–400)
RBC: 4.68 MIL/uL (ref 3.87–5.11)
RDW: 13.5 % (ref 11.5–15.5)
WBC: 6.5 10*3/uL (ref 4.0–10.5)

## 2015-09-11 MED ORDER — ACETAMINOPHEN-CODEINE #3 300-30 MG PO TABS: 1.0000 | ORAL_TABLET | Freq: Three times a day (TID) | ORAL | Status: DC | PRN

## 2015-09-11 MED ORDER — TRAMADOL HCL 50 MG PO TABS: 50.0000 mg | ORAL_TABLET | Freq: Two times a day (BID) | ORAL | Status: DC | PRN

## 2015-09-11 MED ORDER — PAROXETINE HCL 20 MG PO TABS: 20.0000 mg | ORAL_TABLET | Freq: Every day | ORAL | Status: DC

## 2015-09-11 MED ORDER — NABUMETONE 500 MG PO TABS: 500.0000 mg | ORAL_TABLET | Freq: Every day | ORAL | Status: DC

## 2015-09-11 MED ORDER — KETOROLAC TROMETHAMINE 30 MG/ML IM SOLN: 30.0000 mg | Freq: Once | INTRAMUSCULAR | Status: DC

## 2015-09-11 MED ORDER — TRAZODONE HCL 50 MG PO TABS: 25.0000 mg | ORAL_TABLET | Freq: Every evening | ORAL | Status: DC | PRN

## 2015-09-11 MED ORDER — KETOROLAC TROMETHAMINE 30 MG/ML IJ SOLN
30.0000 mg | Freq: Once | INTRAMUSCULAR | Status: AC
  Administered 2015-09-11: 30 mg via INTRAMUSCULAR

## 2015-09-11 MED ORDER — PREGABALIN 150 MG PO CAPS: 150.0000 mg | ORAL_CAPSULE | Freq: Two times a day (BID) | ORAL | Status: DC

## 2015-09-11 NOTE — Progress Notes (Signed)
Patient ID: Cathy James, female   DOB: 10/27/1960, 54 y.o.   MRN: [id number]   Subjective:    Patient ID: Cathy James, female    DOB: 12/18/1960, 54 y.o.   MRN: [id number] CC: establish care, chronic low back pain, b/l shoulder pain with radiculopathy down L arm  HPI 54 yo F presents for f/u chronic pain,     1. Chronic low back pain: since 2005. 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  Had low back surgery in [place] in 11/2013, L4 and L5. Has been released fromr care. Has returned to work last month. Doing on laundry. Has low back pain that radiates to R lateral gluteal pain and sensation of swelling and hurts. Pain radiates down anterior thigh to knee.   2. Shoulder pain: b/l. Since 12/2014.  Worse on L side with tingling down L arm to hand. Has associated popping. Rubbing icy hot twice daily. Superior shoulder pain. Radiates medially toward neck and down cervical neck. 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    3. R hip pain: chronic pain. Worsening. Worse with hip flexion and external rotation. Associated with stiffness and gait disturbance.   Since last OV pain has worsened as she has ran out of tramadol and tylenol #3 and could not get a refill.   4. Fatigue: fatigue. Patient has depression and chronic pain. [facility] as she feels that she was not being listened and the service was rushed. She admits to snoring. She denies blood in stool. 3 days ago she noted some vaginal spotting after not having a period for 2-3 years. She has trouble falling asleep.   Social History  Substance Use Topics  . Smoking status: Current Every Day Smoker  . Smokeless tobacco: Not on file  . Alcohol Use: No    Current Outpatient Prescriptions on File Prior to Visit  Medication Sig Dispense Refill  . calcium carbonate (OS-CAL) 600 MG TABS tablet Take 600 mg by mouth 2 (two) times daily with a meal.    .  cholecalciferol (VITAMIN D) 1000 UNITS tablet Take 1,000 Units by mouth daily.    . fluocinonide (LIDEX) 0.05 % external solution Apply 1 application topically 2 (two) times daily. 60 mL 0  . gabapentin (NEURONTIN) 300 MG capsule Take 2 capsules (600 mg total) by mouth 3 (three) times daily. 180 capsule 5  . gemfibrozil (LOPID) 600 MG tablet Take 600 mg by mouth 2 (two) times daily before a meal.    . hydrochlorothiazide (MICROZIDE) 12.5 MG capsule Take 1 capsule (12.5 mg total) by mouth daily. 30 capsule 11  . methocarbamol (ROBAXIN) 750 MG tablet Take 1 tablet (750 mg total) by mouth every 8 (eight) hours as needed for muscle spasms. 60 tablet 3  . tretinoin (RETIN-A) 0.1 % cream Apply topically at bedtime.    . triamcinolone cream (KENALOG) 0.1 % Apply 1 application topically 2 (two) times daily. 30 g 3  . vitamin B-12 (CYANOCOBALAMIN) 1000 MCG tablet Take 1,000 mcg by mouth daily.    . vitamin C (ASCORBIC ACID) 500 MG tablet Take 500 mg by mouth daily.    . vitamin E 100 UNIT capsule Take by mouth daily.     No current facility-administered medications on file prior to visit.   Review of Systems  Constitutional: Positive for fatigue. Negative for chills.  Respiratory: Negative for shortness of breath.  Gastrointestinal: Negative for blood in stool.  Genitourinary: Positive for vaginal bleeding.  Musculoskeletal: Positive for arthralgias and neck pain.  Skin: Negative for rash.  Psychiatric/Behavioral: Positive for dysphoric mood. Negative for suicidal ideas.       Objective:   Physical Exam  Constitutional: She is oriented to person, place, and time. She appears well-developed and well-nourished. No distress.  HENT:  Head: Normocephalic and atraumatic.  Cardiovascular: Normal rate, regular rhythm, normal heart sounds and intact distal pulses.   Pulmonary/Chest: Effort normal and breath sounds normal.  Musculoskeletal: She exhibits tenderness. She exhibits no edema.  Neurological:  She is alert and oriented to person, place, and time. Gait abnormal.  Skin: Skin is warm and dry. No rash noted.  Psychiatric: She exhibits a depressed mood.   Filed Vitals:   09/11/15 1149  BP: 161/95  Pulse: 77  Temp: 98.8 F (37.1 C)  Resp: 16      Assessment & Plan:  Cathy James was seen today for back pain.  Diagnoses and all orders for this visit:  Midline low back pain, with sciatica presence unspecified -     acetaminophen-codeine (TYLENOL #3) 300-30 MG tablet; Take 1 tablet by mouth every 8 (eight) hours as needed for moderate pain. -     nabumetone (RELAFEN) 500 MG tablet; Take 1-2 tablets (500-1,000 mg total) by mouth daily. -     traMADol (ULTRAM) 50 MG tablet; Take 1 tablet (50 mg total) by mouth every 12 (twelve) hours as needed. -     Ambulatory referral to Pain Clinic -     pregabalin (LYRICA) 150 MG capsule; Take 1 capsule (150 mg total) by mouth 2 (two) times daily. -     ketorolac (TORADOL) 30 MG/ML injection; Inject 1 mL (30 mg total) into the muscle once.  Right hip pain -     acetaminophen-codeine (TYLENOL #3) 300-30 MG tablet; Take 1 tablet by mouth every 8 (eight) hours as needed for moderate pain. -     nabumetone (RELAFEN) 500 MG tablet; Take 1-2 tablets (500-1,000 mg total) by mouth daily. -     traMADol (ULTRAM) 50 MG tablet; Take 1 tablet (50 mg total) by mouth every 12 (twelve) hours as needed. -     Ambulatory referral to Pain Clinic -     pregabalin (LYRICA) 150 MG capsule; Take 1 capsule (150 mg total) by mouth 2 (two) times daily. -     ketorolac (TORADOL) 30 MG/ML injection; Inject 1 mL (30 mg total) into the muscle once.  Bilateral shoulder pain -     acetaminophen-codeine (TYLENOL #3) 300-30 MG tablet; Take 1 tablet by mouth every 8 (eight) hours as needed for moderate pain. -     nabumetone (RELAFEN) 500 MG tablet; Take 1-2 tablets (500-1,000 mg total) by mouth daily. -     traMADol (ULTRAM) 50 MG tablet; Take 1 tablet (50 mg total) by mouth every  12 (twelve) hours as needed. -     Ambulatory referral to Pain Clinic -     pregabalin (LYRICA) 150 MG capsule; Take 1 capsule (150 mg total) by mouth 2 (two) times daily. -     ketorolac (TORADOL) 30 MG/ML injection; Inject 1 mL (30 mg total) into the muscle once.  Depression -     PARoxetine (PAXIL) 20 MG tablet; Take 1 tablet (20 mg total) by mouth daily. -     traZODone (DESYREL) 50 MG tablet; Take 0.5-1 tablets (25-50 mg total) by mouth at bedtime as  needed for sleep.  Chronic fatigue -     Ambulatory referral to Sleep Studies -     CBC  Postmenopausal bleeding  Chronic pain syndrome  Essential hypertension

## 2015-09-11 NOTE — Assessment & Plan Note (Signed)
Postmenopausal vaginal bleeding Plan for pelvic exam and likely TVUS/pelvic [place]

## 2015-09-11 NOTE — Assessment & Plan Note (Signed)
A: HTN in pain P: Treat pain Continue HCTZ 12.5 mg If BP elevated at next weeks f/u will increase to 25 mg

## 2015-09-11 NOTE — Progress Notes (Signed)
F/U shoulder neck and back pain  C/C low energy, unable to sleep. Worsen since last visit Pain scale #6 tobacco user 1ppday  No suicidal thought in the past two weks

## 2015-09-11 NOTE — Addendum Note (Signed)
Addended by: Dessa PhiFUNCHES, [place] on: 09/11/2015 04:55 PM   Modules accepted: Orders, Medications

## 2015-09-11 NOTE — Assessment & Plan Note (Signed)
A: fatigue due to depression and chronic pain most likely. Patient is a smoker. There may be some element of COPD. She snores. There is possible sleep apnea P: CBC Sleep study Treat depression Treat chronic pain

## 2015-09-11 NOTE — Assessment & Plan Note (Signed)
A: depression, [facility] at [facility] with fatigue and poor sleep and chronic pain P: Add trazodone Will send info on [address] [facility]

## 2015-09-11 NOTE — Patient Instructions (Addendum)
Krystie was seen today for back pain.  Diagnoses and all orders for this visit:  Midline low back pain, with sciatica presence unspecified -     acetaminophen-codeine (TYLENOL #3) 300-30 MG tablet; Take 1 tablet by mouth every 8 (eight) hours as needed for moderate pain. -     nabumetone (RELAFEN) 500 MG tablet; Take 1-2 tablets (500-1,000 mg total) by mouth daily. -     traMADol (ULTRAM) 50 MG tablet; Take 1 tablet (50 mg total) by mouth every 12 (twelve) hours as needed. -     Ambulatory referral to Pain Clinic -     pregabalin (LYRICA) 150 MG capsule; Take 1 capsule (150 mg total) by mouth 2 (two) times daily. -     ketorolac (TORADOL) 30 MG/ML injection; Inject 1 mL (30 mg total) into the muscle once.  Right hip pain -     acetaminophen-codeine (TYLENOL #3) 300-30 MG tablet; Take 1 tablet by mouth every 8 (eight) hours as needed for moderate pain. -     nabumetone (RELAFEN) 500 MG tablet; Take 1-2 tablets (500-1,000 mg total) by mouth daily. -     traMADol (ULTRAM) 50 MG tablet; Take 1 tablet (50 mg total) by mouth every 12 (twelve) hours as needed. -     Ambulatory referral to Pain Clinic -     pregabalin (LYRICA) 150 MG capsule; Take 1 capsule (150 mg total) by mouth 2 (two) times daily. -     ketorolac (TORADOL) 30 MG/ML injection; Inject 1 mL (30 mg total) into the muscle once.  Bilateral shoulder pain -     acetaminophen-codeine (TYLENOL #3) 300-30 MG tablet; Take 1 tablet by mouth every 8 (eight) hours as needed for moderate pain. -     nabumetone (RELAFEN) 500 MG tablet; Take 1-2 tablets (500-1,000 mg total) by mouth daily. -     traMADol (ULTRAM) 50 MG tablet; Take 1 tablet (50 mg total) by mouth every 12 (twelve) hours as needed. -     Ambulatory referral to Pain Clinic -     pregabalin (LYRICA) 150 MG capsule; Take 1 capsule (150 mg total) by mouth 2 (two) times daily. -     ketorolac (TORADOL) 30 MG/ML injection; Inject 1 mL (30 mg total) into the muscle once.  Depression -      PARoxetine (PAXIL) 20 MG tablet; Take 1 tablet (20 mg total) by mouth daily.  Chronic fatigue -     traZODone (DESYREL) 50 MG tablet; Take 0.5-1 tablets (25-50 mg total) by mouth at bedtime as needed for sleep. -     Ambulatory referral to Sleep Studies -     CBC  Vaginal spotting   Drop off lyrica Rx to apply for PASS  F/u in on 09/18/15 at 2:30 for pelvic exam for vaginal spotting  Dr. Armen PickupFunches

## 2015-09-11 NOTE — Assessment & Plan Note (Addendum)
A: chronic pain: P: Refilled medications Added lyrica patient to apply for PASS for lyrica if she gets lyrica it will replace gabapentin Pain management referral  IM toradol

## 2015-09-13 IMAGING — CR DG CERVICAL SPINE 2 OR 3 VIEWS
3 series · 3 of 3 positions shown · non-contrast
Comparison: None.

CLINICAL DATA: Bilateral shoulder pain with left-sided radicular
symptoms.

EXAM:
CERVICAL SPINE - 2-3 VIEW

[c-spine lat]
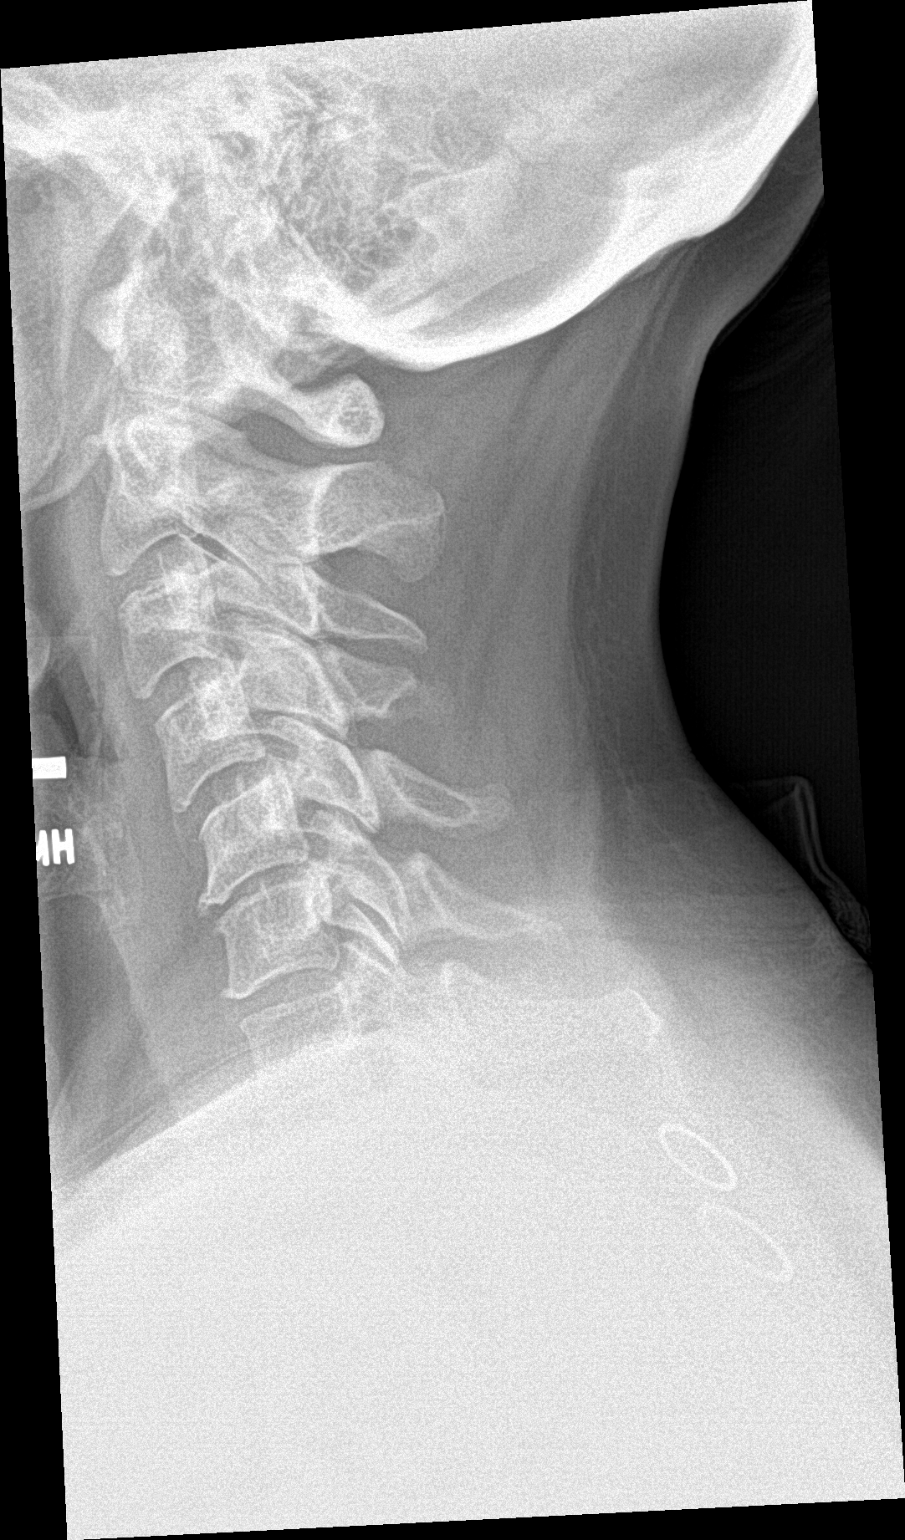

[c-spine ap]
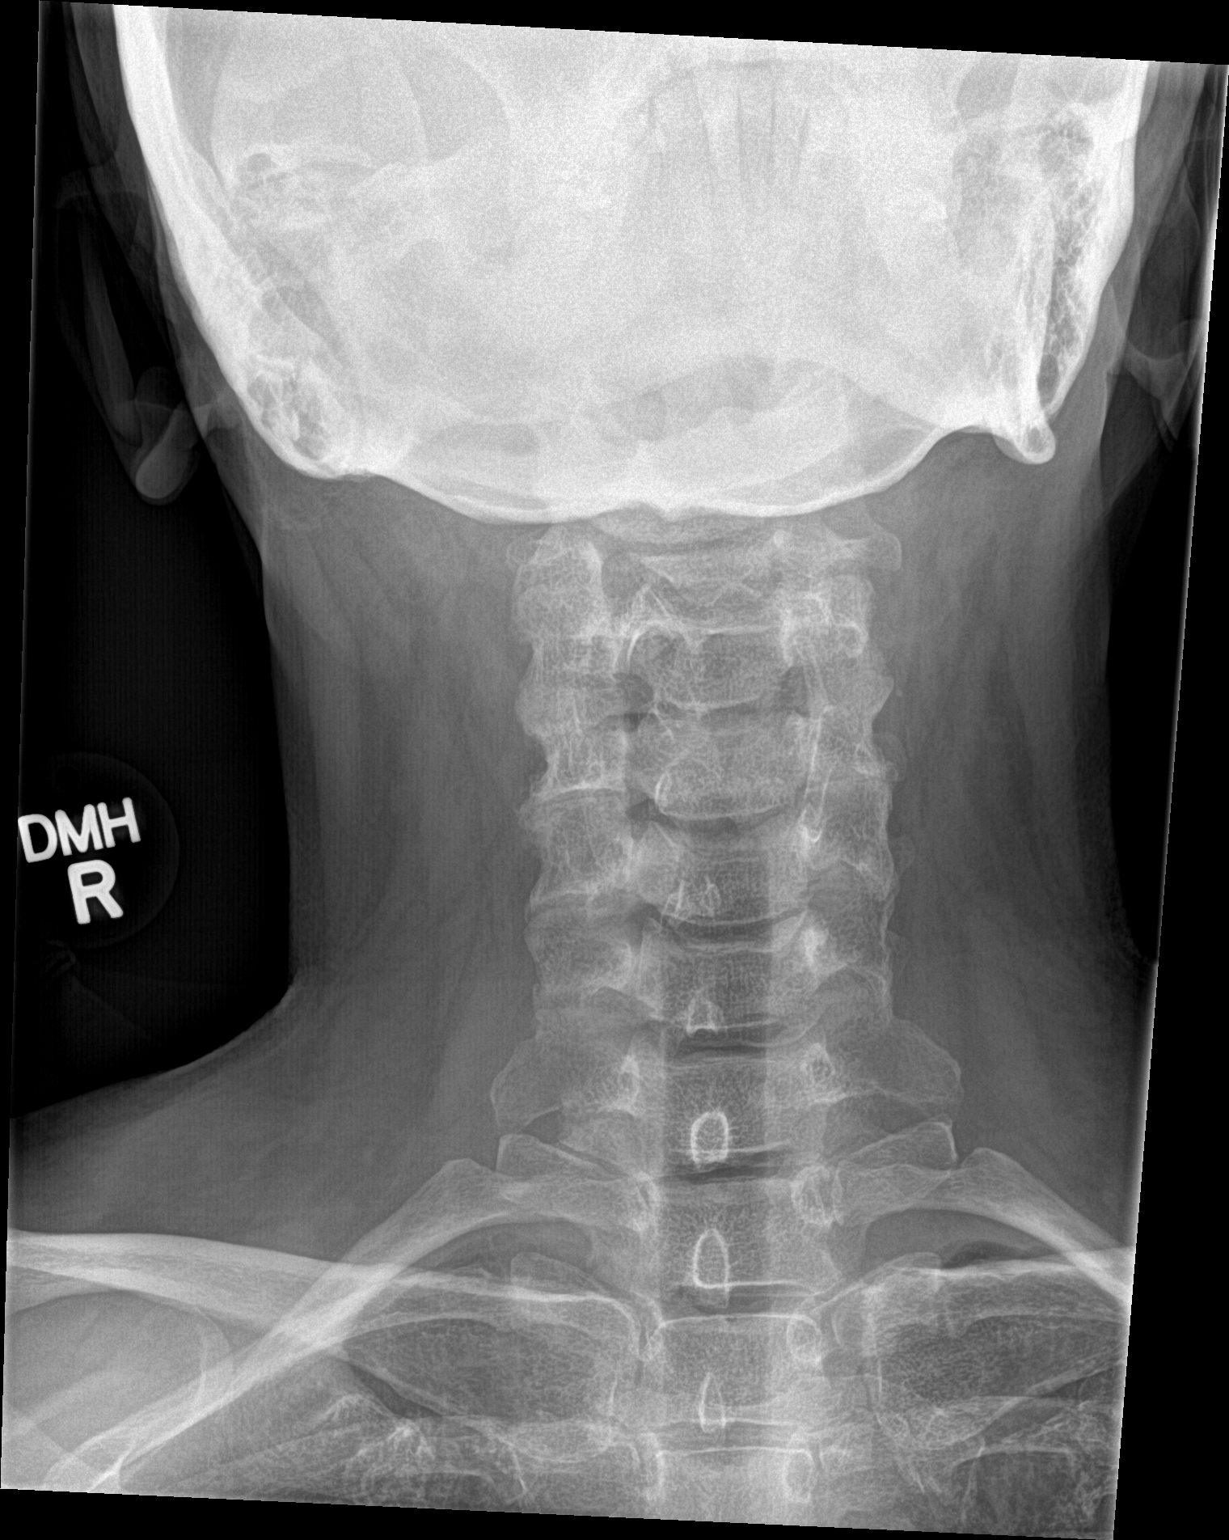

[c-spine open mouth]
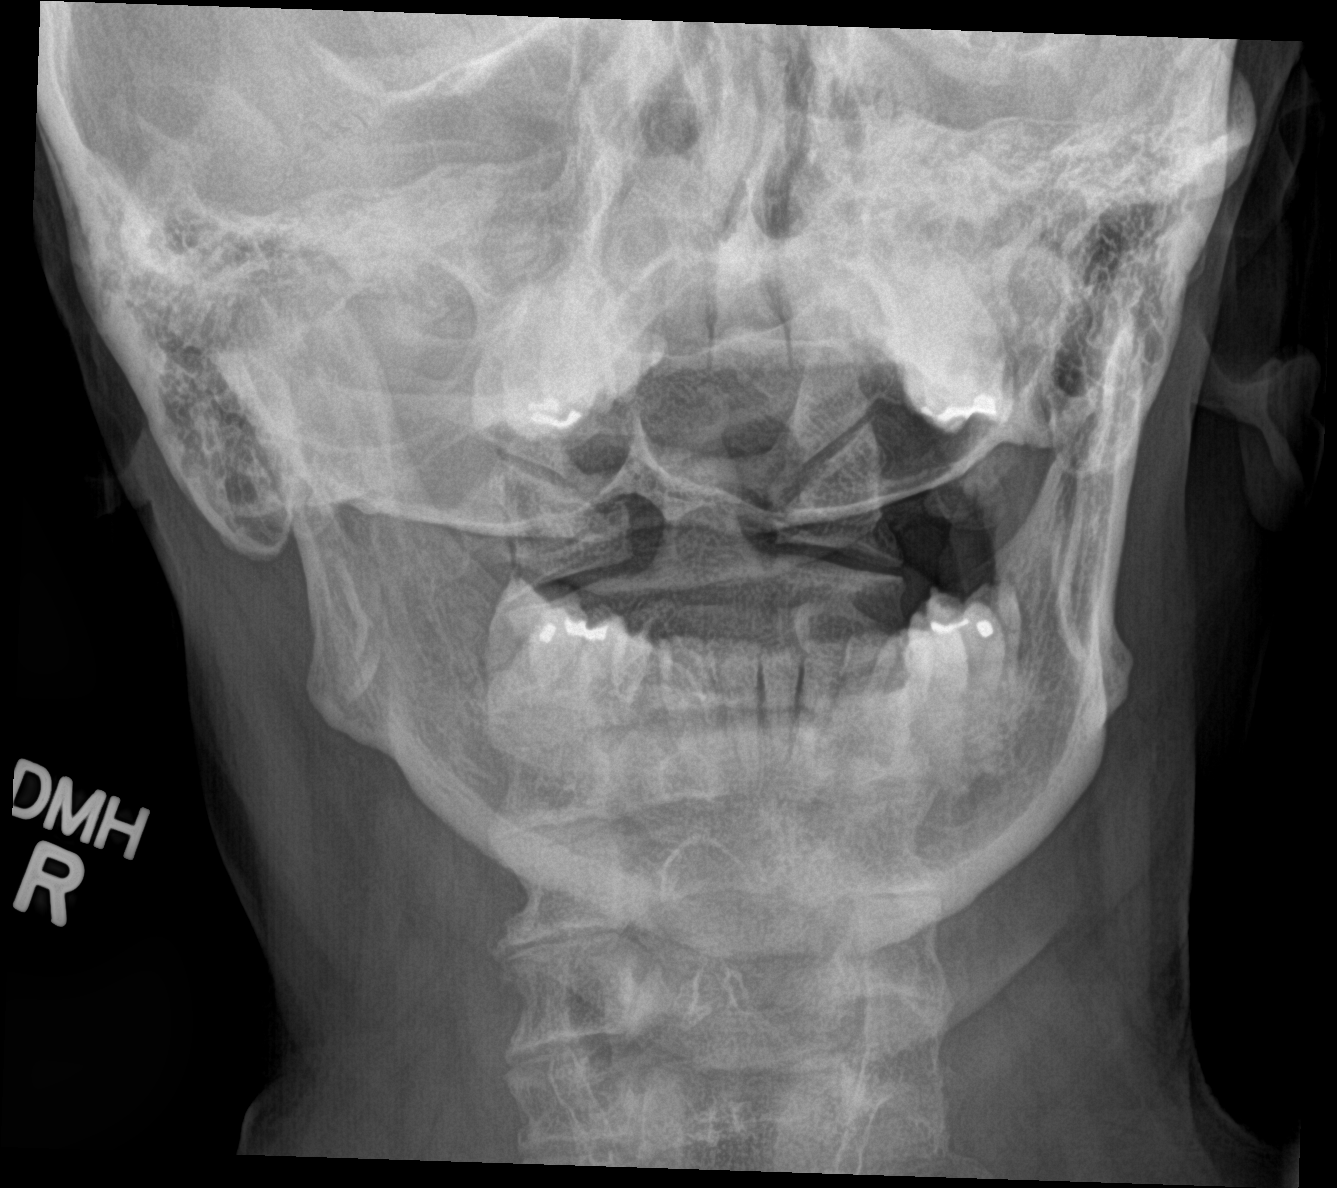

[3 of 3 positions shown; findings below may reference images not displayed]

FINDINGS: There is slight reversal of the normal cervical lordosis. There is
trace anterolisthesis of C4 on C5. C7-T1 alignment cannot be
adequately assessed due to obscuration of the T1 vertebral body by
shoulder tissues on the lateral image.

Vertebral body heights are preserved. Moderate disc space narrowing
is present at C5-6 with mild anterior endplate osteophytosis.
Minimal endplate spurring is present at C6-7. No cervical spine
fracture is identified. Prevertebral soft tissues are within normal
limits.
IMPRESSION: Moderate C5-6 disc degeneration.

## 2015-09-16 ENCOUNTER — Telehealth: Payer: Self-pay

## 2015-09-16 NOTE — Telephone Encounter (Signed)
Tried to call patient this am  Patient not available Message left on voice mail to return our call 

## 2015-09-16 NOTE — Telephone Encounter (Signed)
Returned patient phone call and she is aware of her normal labs 

## 2015-09-16 NOTE — Telephone Encounter (Signed)
-----   Message from Dessa PhiJosalyn Funches, MD sent at 09/12/2015  8:58 AM EST ----- Normal CBC

## 2015-09-18 ENCOUNTER — Encounter (HOSPITAL_BASED_OUTPATIENT_CLINIC_OR_DEPARTMENT_OTHER): Payer: Self-pay | Admitting: Clinical

## 2015-09-18 ENCOUNTER — Ambulatory Visit: Payer: Self-pay | Attending: Family Medicine | Admitting: Family Medicine

## 2015-09-18 ENCOUNTER — Encounter: Payer: Self-pay | Admitting: Family Medicine

## 2015-09-18 VITALS — BP 136/85 | HR 76 | Temp 98.6°F | Resp 16 | Ht 62.0 in | Wt 157.0 lb

## 2015-09-18 DIAGNOSIS — N760 Acute vaginitis: Secondary | ICD-10-CM

## 2015-09-18 DIAGNOSIS — R87810 Cervical high risk human papillomavirus (HPV) DNA test positive: Secondary | ICD-10-CM

## 2015-09-18 DIAGNOSIS — N898 Other specified noninflammatory disorders of vagina: Secondary | ICD-10-CM

## 2015-09-18 DIAGNOSIS — B356 Tinea cruris: Secondary | ICD-10-CM | POA: Insufficient documentation

## 2015-09-18 DIAGNOSIS — F17210 Nicotine dependence, cigarettes, uncomplicated: Secondary | ICD-10-CM | POA: Insufficient documentation

## 2015-09-18 DIAGNOSIS — Z79899 Other long term (current) drug therapy: Secondary | ICD-10-CM | POA: Insufficient documentation

## 2015-09-18 DIAGNOSIS — R87610 Atypical squamous cells of undetermined significance on cytologic smear of cervix (ASC-US): Secondary | ICD-10-CM

## 2015-09-18 DIAGNOSIS — R21 Rash and other nonspecific skin eruption: Secondary | ICD-10-CM | POA: Insufficient documentation

## 2015-09-18 DIAGNOSIS — A499 Bacterial infection, unspecified: Secondary | ICD-10-CM

## 2015-09-18 DIAGNOSIS — N950 Postmenopausal bleeding: Secondary | ICD-10-CM | POA: Insufficient documentation

## 2015-09-18 DIAGNOSIS — B9689 Other specified bacterial agents as the cause of diseases classified elsewhere: Secondary | ICD-10-CM

## 2015-09-18 DIAGNOSIS — Z124 Encounter for screening for malignant neoplasm of cervix: Secondary | ICD-10-CM | POA: Insufficient documentation

## 2015-09-18 DIAGNOSIS — Z658 Other specified problems related to psychosocial circumstances: Secondary | ICD-10-CM

## 2015-09-18 MED ORDER — KETOCONAZOLE 2 % EX CREA: 1.0000 "application " | TOPICAL_CREAM | Freq: Every day | CUTANEOUS | Status: DC

## 2015-09-18 NOTE — Assessment & Plan Note (Signed)
postmenopausal bleeding, normal exam today with no active bleeding or pelvic fullness or lesions. Pap done today.  Await pap and monitor symptoms Low threshold for pelvic US of bleeding increases or she develops pelvic pain.

## 2015-09-18 NOTE — Progress Notes (Signed)
Patient ID: Cathy James, female   DOB: 07/09/1961, 54 y.o.   MRN: [id number]   Subjective:  Patient ID: Cathy James, female    DOB: 08/01/1961  Age: 54 y.o. MRN: [id number]  CC: Vaginal Bleeding   HPI Cathy James presents for    1. Postmenopausal bleeding: here today for pelvic exam. No pelvic pain. She last saw bleeding one week ago. She cannot recall her last pap. Bleeding is scant usually large red/brown. Sometimes there is also discharge.   2. Rash: in groin area. For many years. Comes and goes. Has used OTC cream.  Social History  Substance Use Topics  . Smoking status: Current Every Day Smoker  . Smokeless tobacco: Not on file  . Alcohol Use: No    Outpatient Prescriptions Prior to Visit  Medication Sig Dispense Refill  . acetaminophen-codeine (TYLENOL #3) 300-30 MG tablet Take 1 tablet by mouth every 8 (eight) hours as needed for moderate pain. 90 tablet 2  . calcium carbonate (OS-CAL) 600 MG TABS tablet Take 600 mg by mouth 2 (two) times daily with a meal.    . cholecalciferol (VITAMIN D) 1000 UNITS tablet Take 1,000 Units by mouth daily.    . fluocinonide (LIDEX) 0.05 % external solution Apply 1 application topically 2 (two) times daily. 60 mL 0  . gabapentin (NEURONTIN) 300 MG capsule Take 2 capsules (600 mg total) by mouth 3 (three) times daily. 180 capsule 5  . hydrochlorothiazide (MICROZIDE) 12.5 MG capsule Take 1 capsule (12.5 mg total) by mouth daily. 30 capsule 11  . methocarbamol (ROBAXIN) 750 MG tablet Take 1 tablet (750 mg total) by mouth every 8 (eight) hours as needed for muscle spasms. 60 tablet 3  . nabumetone (RELAFEN) 500 MG tablet Take 1-2 tablets (500-1,000 mg total) by mouth daily. 60 tablet 2  . PARoxetine (PAXIL) 20 MG tablet Take 1 tablet (20 mg total) by mouth daily. 30 tablet 5  . traMADol (ULTRAM) 50 MG tablet Take 1 tablet (50 mg total) by mouth every 12 (twelve) hours as needed. 60 tablet 2  . traZODone (DESYREL) 50 MG tablet Take 0.5-1 tablets  (25-50 mg total) by mouth at bedtime as needed for sleep. 30 tablet 3  . tretinoin (RETIN-A) 0.1 % cream Apply topically at bedtime.    . triamcinolone cream (KENALOG) 0.1 % Apply 1 application topically 2 (two) times daily. 30 g 3  . vitamin B-12 (CYANOCOBALAMIN) 1000 MCG tablet Take 1,000 mcg by mouth daily.    . vitamin C (ASCORBIC ACID) 500 MG tablet Take 500 mg by mouth daily.    . vitamin E 100 UNIT capsule Take by mouth daily.    Marland Kitchen. gemfibrozil (LOPID) 600 MG tablet Take 600 mg by mouth 2 (two) times daily before a meal. Reported on 09/18/2015    . pregabalin (LYRICA) 150 MG capsule Take 1 capsule (150 mg total) by mouth 2 (two) times daily. (Patient not taking: Reported on 09/18/2015) 60 capsule 3   No facility-administered medications prior to visit.    ROS Review of Systems  Constitutional: Negative for fever and chills.  Eyes: Negative for visual disturbance.  Respiratory: Negative for shortness of breath.   Cardiovascular: Negative for chest pain.  Gastrointestinal: Negative for abdominal pain and blood in stool.  Genitourinary: Positive for vaginal bleeding, vaginal discharge and menstrual problem.  Musculoskeletal: Negative for back pain and arthralgias.  Skin: Positive for rash.  Allergic/Immunologic: Negative for immunocompromised state.  Hematological: Negative for adenopathy. Does not  bruise/bleed easily.  Psychiatric/Behavioral: Negative for suicidal ideas and dysphoric mood.    Objective:  BP 136/85 mmHg  Pulse 76  Temp(Src) 98.6 F (37 C) (Oral)  Resp 16  Ht  (1.575 m)  Wt 157 lb (71.215 kg)  BMI 28.71 kg/m2  SpO2 95%  BP/Weight 09/18/2015 09/11/2015 06/11/2015  Systolic BP 136 161 146  Diastolic BP 85 95 85  Wt. (Lbs) 157 154 161  BMI 28.71 28.16 29.44   Physical Exam  Constitutional: She is oriented to person, place, and time. She appears well-developed and well-nourished. No distress.  HENT:  Head: Normocephalic and atraumatic.    Cardiovascular: Normal rate, regular rhythm, normal heart sounds and intact distal pulses.   Pulmonary/Chest: Effort normal and breath sounds normal.  Genitourinary: Vagina normal and uterus normal.    Pelvic exam was performed with patient prone. There is no rash, tenderness or lesion on the right labia. There is no rash, tenderness or lesion on the left labia. Cervix exhibits no motion tenderness, no discharge and no friability. No vaginal discharge found.  Musculoskeletal: She exhibits no edema.  Lymphadenopathy:       Right: No inguinal adenopathy present.       Left: No inguinal adenopathy present.  Neurological: She is alert and oriented to person, place, and time.  Skin: Skin is warm and dry. No rash noted.  Psychiatric: She has a normal mood and affect.     Assessment & Plan:   Problem List Items Addressed This Visit    Postmenopausal bleeding - Primary     postmenopausal bleeding, normal exam today with no active bleeding or pelvic fullness or lesions. Pap done today.  Await pap and monitor symptoms Low threshold for pelvic US of bleeding increases or she develops pelvic pain.         Other Visit Diagnoses    Vaginal discharge        Relevant Orders    Cytology - PAP [place]    Pap smear for cervical cancer screening        Relevant Orders    Cytology - PAP [place]    Tinea cruris        Relevant Medications    ketoconazole (NIZORAL) 2 % cream       No orders of the defined types were placed in this encounter.    Follow-up: No Follow-up on file.   Dessa Phi MD

## 2015-09-18 NOTE — Patient Instructions (Addendum)
Cathy James was seen today for vaginal bleeding.  Diagnoses and all orders for this visit:  Postmenopausal bleeding -     Cancel: Cytology - PAP [place]  Vaginal discharge -     Cancel: Cytology - PAP [place] -     Cytology - PAP [place]  Pap smear for cervical cancer screening -     Cancel: Cytology - PAP [place] -     Cytology - PAP [place]  Tinea cruris -     ketoconazole (NIZORAL) 2 % cream; Apply 1 application topically daily.   Normal pelvic exam except yeast on skin called tinea, antifungal cream ordered  You will be called with pap results   F/u in 8 weeks for chronic pain and HTN   Dr. Armen PickupFunches

## 2015-09-18 NOTE — Progress Notes (Signed)
C/C Vaginal bleeding and discharge pink/brown  With odor, rash and itchy  on leg and vaginal area  Tobacco 1 ppday  No pain today  No suicidal thought in the past two weeks

## 2015-09-18 NOTE — Progress Notes (Signed)
ASSESSMENT: Pt currently experiencing psychosocial stressors, [facility]; would benefit from community resources, as well as supportive counseling regarding coping with psychosocial stressors.  Stage of Change: contemplative  PLAN: 1. F/U with behavioral health consultant in two  weeks 2. Psychiatric Medications: Paxil, Desyrel 3. Behavioral recommendation(s):   -Consider reading educational material regarding coping with symptoms of anxiety and depression  -Consider food bank options -Consider community resource list of agencies that help with utilities SUBJECTIVE: Pt. referred by Dr Armen PickupFunches for symptoms of anxiety and depression:  Pt. reports the following symptoms/concerns: Pt states that this past year has been a difficult one for her, that she probably needs help with food but "I've got too much pride right now"; she says she wants to come back to talk further, and that she needs to talk to someone, but does not have time today. Duration of problem: At least a year Severity: moderate  OBJECTIVE: Orientation & Cognition: Oriented x3. Thought processes normal and appropriate to situation. Mood: appropriate. Affect: appropriate Appearance: appropriate Risk of harm to self or others: no known risk of harm to self or others Substance use: tobacco Assessments administered: PHQ9: 16/ GAD7: 12  Diagnosis: Psychosocial stressors CPT Code: Z65.8 -------------------------------------------- Other(s) present in the room: none  Time spent with patient in exam room: 10 minutes

## 2015-09-19 LAB — CYTOLOGY - PAP

## 2015-09-20 LAB — CERVICOVAGINAL ANCILLARY ONLY
CHLAMYDIA, DNA PROBE: NEGATIVE
NEISSERIA GONORRHEA: NEGATIVE

## 2015-09-23 ENCOUNTER — Telehealth: Payer: Self-pay

## 2015-09-23 LAB — CERVICOVAGINAL ANCILLARY ONLY: WET PREP (BD AFFIRM): POSITIVE — AB

## 2015-09-23 MED ORDER — FLUCONAZOLE 150 MG PO TABS: 150.0000 mg | ORAL_TABLET | Freq: Once | ORAL | Status: DC

## 2015-09-23 MED ORDER — METRONIDAZOLE 500 MG PO TABS: 500.0000 mg | ORAL_TABLET | Freq: Two times a day (BID) | ORAL | Status: AC

## 2015-09-23 NOTE — Addendum Note (Signed)
Addended by: Dessa PhiFUNCHES, Vitaly Wanat on: 09/23/2015 12:00 PM   Modules accepted: Orders

## 2015-09-23 NOTE — Telephone Encounter (Signed)
Spoke with patient this am and she is aware of her pelvic Exam results

## 2015-09-23 NOTE — Telephone Encounter (Signed)
-----   Message from Dessa PhiJosalyn Funches, MD sent at 09/23/2015 11:58 AM EST ----- BV on wet prep Screening GC/chlam negative

## 2015-09-25 NOTE — Addendum Note (Signed)
Addended by: Dessa PhiFUNCHES, Keldric Poyer on: 09/25/2015 09:40 PM   Modules accepted: Orders

## 2015-09-26 ENCOUNTER — Telehealth: Payer: Self-pay | Admitting: *Deleted

## 2015-09-26 NOTE — Telephone Encounter (Signed)
Left voice message to return call 

## 2015-09-26 NOTE — Telephone Encounter (Signed)
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 Gyn referral placed.

## 2015-09-29 ENCOUNTER — Encounter: Payer: Self-pay | Admitting: Obstetrics & Gynecology

## 2015-10-03 MED FILL — ACETAMINOPHEN/COD #3 TABLET: 300-30 | 30 days supply | Qty: 90 | Fill #1

## 2015-10-03 MED FILL — traMADol HCL 50 MG TABS: 50 | 30 days supply | Qty: 60 | Fill #1

## 2015-10-03 MED FILL — NABUMETONE 500 MG TABLET: 500 | 30 days supply | Qty: 60 | Fill #1

## 2015-10-03 MED FILL — METHOCARBAMOL 750 MG TABLET: 750 | 20 days supply | Qty: 60 | Fill #3

## 2015-10-09 ENCOUNTER — Other Ambulatory Visit: Payer: Self-pay

## 2015-10-15 MED FILL — PARoxetine HCL 20 MG TABS: 20 | 30 days supply | Qty: 30 | Fill #1

## 2015-10-15 MED FILL — HYDROCHLOROTHIAZIDE 12.5 MG: 12.5 | 30 days supply | Qty: 30 | Fill #6

## 2015-10-15 MED FILL — GABAPENTIN 300 MG CAPSULE: 300 | 30 days supply | Qty: 180 | Fill #3

## 2015-10-21 ENCOUNTER — Telehealth: Payer: Self-pay | Admitting: General Practice

## 2015-10-21 NOTE — Telephone Encounter (Signed)
Called pt and advised her of need for Colposcopy due to abnormal Pap.  Brief explanation of procedure given.  Pt voiced understanding.

## 2015-10-21 NOTE — Telephone Encounter (Signed)
Patient called and left message on nurse line stating she wanted to know what her appt on 2/2 was for. Called patient, no answer- left message stating we are trying to reach you to return your call, please call us back at the clinics

## 2015-10-23 ENCOUNTER — Encounter: Payer: Self-pay | Admitting: Obstetrics & Gynecology

## 2015-10-23 ENCOUNTER — Other Ambulatory Visit: Payer: Self-pay | Admitting: Family Medicine

## 2015-10-23 MED FILL — KETOCONAZOLE 2% CREAM: 2 | 30 days supply | Qty: 60 | Fill #0

## 2015-10-24 MED FILL — METHOCARBAMOL 750 MG TABLET: 750 | 20 days supply | Qty: 60 | Fill #0

## 2015-10-28 ENCOUNTER — Other Ambulatory Visit: Payer: Self-pay | Admitting: Family Medicine

## 2015-10-28 MED FILL — NABUMETONE 500 MG TABLET: 500 | 30 days supply | Qty: 60 | Fill #2

## 2015-10-28 MED FILL — FLUOCINONIDE 0.05% SOLUTION: 0.05 | 7 days supply | Qty: 60 | Fill #0

## 2015-10-28 MED FILL — TRIAMCINOLONE 0.1% CREAM: 0.1 | 14 days supply | Qty: 30 | Fill #3

## 2015-10-31 MED FILL — traMADol HCL 50 MG TABS: 50 | 30 days supply | Qty: 60 | Fill #2

## 2015-10-31 MED FILL — ACETAMINOPHEN/COD #3 TABLET: 300-30 | 30 days supply | Qty: 90 | Fill #2

## 2015-11-12 ENCOUNTER — Ambulatory Visit: Payer: Self-pay | Attending: Family Medicine

## 2015-11-12 ENCOUNTER — Ambulatory Visit (HOSPITAL_BASED_OUTPATIENT_CLINIC_OR_DEPARTMENT_OTHER): Payer: Self-pay

## 2015-11-19 ENCOUNTER — Other Ambulatory Visit: Payer: Self-pay | Admitting: Family Medicine

## 2015-11-19 MED FILL — GABAPENTIN 300 MG CAPSULE: 300 | 30 days supply | Qty: 180 | Fill #4

## 2015-11-19 MED FILL — ?HYDROCHLOROTHIAZIDE 12.5MG: 12.5 | 30 days supply | Qty: 30 | Fill #7

## 2015-11-19 MED FILL — PARoxetine HCL 20 MG TABS: 20 | 30 days supply | Qty: 30 | Fill #2

## 2015-11-19 MED FILL — METHOCARBAMOL 750 MG TABLET: 750 | 20 days supply | Qty: 60 | Fill #1

## 2015-11-21 ENCOUNTER — Telehealth: Payer: Self-pay | Admitting: Family Medicine

## 2015-11-21 DIAGNOSIS — M545 Low back pain: Secondary | ICD-10-CM

## 2015-11-21 DIAGNOSIS — M25512 Pain in left shoulder: Secondary | ICD-10-CM

## 2015-11-21 DIAGNOSIS — M25511 Pain in right shoulder: Secondary | ICD-10-CM

## 2015-11-21 DIAGNOSIS — M25551 Pain in right hip: Secondary | ICD-10-CM

## 2015-11-21 NOTE — Telephone Encounter (Signed)
Patient came in requesting a medication refill for Tylenol 3, Please follow up.

## 2015-11-24 MED FILL — FLUOCINONIDE 0.05% SOLUTION: 0.05 | 7 days supply | Qty: 60 | Fill #0

## 2015-11-24 MED FILL — TRIAMCINOLONE 0.1% CREAM: 0.1 | 15 days supply | Qty: 30 | Fill #0

## 2015-11-24 MED FILL — NABUMETONE 500 MG TABLET: 500 | 30 days supply | Qty: 60 | Fill #0

## 2015-11-24 NOTE — Telephone Encounter (Signed)
Pt. Called requesting a med refill on the following medications:  Tylenol # 3 Tramadol  Please f/u

## 2015-11-25 MED ORDER — ACETAMINOPHEN-CODEINE #3 300-30 MG PO TABS: 1.0000 | ORAL_TABLET | Freq: Three times a day (TID) | ORAL | Status: DC | PRN

## 2015-11-25 MED ORDER — TRAMADOL HCL 50 MG PO TABS
50.0000 mg | ORAL_TABLET | Freq: Two times a day (BID) | ORAL | Status: DC | PRN
Start: 2015-11-25 — End: 2015-12-29

## 2015-11-25 MED FILL — traZODone HCL 50 MG TABS: 50 | 30 days supply | Qty: 30 | Fill #1

## 2015-11-25 NOTE — Telephone Encounter (Signed)
meds refilled and ready for pick up   

## 2015-11-26 NOTE — Telephone Encounter (Signed)
Pt notified Rx at front office ready to be pickup 

## 2015-11-27 MED FILL — ACETAMINOPHEN/COD #3 TABLET: 300-30 | 30 days supply | Qty: 90 | Fill #0

## 2015-11-27 MED FILL — traMADol HCL 50 MG TABS: 50 | 30 days supply | Qty: 60 | Fill #0

## 2015-12-22 ENCOUNTER — Other Ambulatory Visit: Payer: Self-pay | Admitting: Family Medicine

## 2015-12-22 DIAGNOSIS — G894 Chronic pain syndrome: Secondary | ICD-10-CM

## 2015-12-22 MED FILL — ?PAROXETINE 20 MG TABLET: 20 MG | 30 days supply | Qty: 30 | Fill #3

## 2015-12-22 MED FILL — NABUMETONE 500 MG TABLET: 500 | 30 days supply | Qty: 60 | Fill #1

## 2015-12-22 MED FILL — ACETAMINOPHEN/COD #3 TABLET: 300-30 | 30 days supply | Qty: 90 | Fill #1

## 2015-12-22 MED FILL — ?HYDROCHLOROTHIAZIDE 12.5MG: 12.5 | 30 days supply | Qty: 30 | Fill #8

## 2015-12-22 MED FILL — traMADol HCL 50 MG TABS: 50 | 30 days supply | Qty: 60 | Fill #1

## 2015-12-23 MED FILL — METHOCARBAMOL 750 MG TABLET: 750 | 20 days supply | Qty: 60 | Fill #0

## 2015-12-25 ENCOUNTER — Encounter: Payer: Self-pay | Admitting: Physical Medicine & Rehabilitation

## 2015-12-25 ENCOUNTER — Encounter: Payer: Self-pay | Attending: Physical Medicine & Rehabilitation

## 2015-12-25 ENCOUNTER — Ambulatory Visit (HOSPITAL_BASED_OUTPATIENT_CLINIC_OR_DEPARTMENT_OTHER): Payer: Self-pay | Admitting: Physical Medicine & Rehabilitation

## 2015-12-25 VITALS — BP 146/84 | HR 79 | Resp 14

## 2015-12-25 DIAGNOSIS — I10 Essential (primary) hypertension: Secondary | ICD-10-CM | POA: Insufficient documentation

## 2015-12-25 DIAGNOSIS — G8929 Other chronic pain: Secondary | ICD-10-CM | POA: Insufficient documentation

## 2015-12-25 DIAGNOSIS — M545 Low back pain: Secondary | ICD-10-CM | POA: Insufficient documentation

## 2015-12-25 DIAGNOSIS — Z79899 Other long term (current) drug therapy: Secondary | ICD-10-CM | POA: Insufficient documentation

## 2015-12-25 DIAGNOSIS — M542 Cervicalgia: Secondary | ICD-10-CM | POA: Insufficient documentation

## 2015-12-25 DIAGNOSIS — M961 Postlaminectomy syndrome, not elsewhere classified: Secondary | ICD-10-CM | POA: Insufficient documentation

## 2015-12-25 DIAGNOSIS — M549 Dorsalgia, unspecified: Secondary | ICD-10-CM

## 2015-12-25 DIAGNOSIS — Z5181 Encounter for therapeutic drug level monitoring: Secondary | ICD-10-CM

## 2015-12-25 DIAGNOSIS — F17200 Nicotine dependence, unspecified, uncomplicated: Secondary | ICD-10-CM | POA: Insufficient documentation

## 2015-12-25 DIAGNOSIS — M25511 Pain in right shoulder: Secondary | ICD-10-CM | POA: Insufficient documentation

## 2015-12-25 NOTE — Progress Notes (Signed)
Subjective:    Patient ID: Cathy James, female    DOB: July 31, 1961, 55 y.o.   MRN: [id number]  HPI 55 year old female with primary complaint of neck and right shoulder pain, Pain will radiate down to the right forearm but not into the wrist or hand The neck and shoulder pain started approximately one year ago. There was no history of recent trauma.  Patient has pain when she lays on that side at night she has to prop up her right arm on a pillow when she lays on her left side. No significant weakness or numbness. Patient gets partial relief with tramadol which she takes together with Tylenol 3. She is supposed to take one tablet of Tylenol 3 every 8 hours but often takes 2 or sometimes even 3 at a time. It works better that way for her. She was supposed to start Lyrica but did not get a pharmacy assistance program approved. She has been taking gabapentin 600 mg 3 times a day but this does not seem to be helpful for her neck or shoulder pain.  Worker's Comp. Injury in 2005,chronic low back pain that radiates toward the right hip.Received physical therapy as well as injections to her low back area. Seen by Dr. Noel Gerold, orthopedic spine surgeon, performed L4-L5 laminectomy in 2015. Partial relief of leg pain, no relief of back pain.  Reviewed NCS SR, appropriate Pain Inventory Average Pain 9 Pain Right Now 7 My pain is constant, sharp, dull, stabbing and aching  In the last 24 hours, has pain interfered with the following? General activity 5 Relation with others 6 Enjoyment of life 7 What TIME of day is your pain at its worst? all Sleep (in general) Poor  Pain is worse with: walking, bending, sitting, inactivity, standing and some activites Pain improves with: rest, heat/ice and medication Relief from Meds: 1  Mobility walk without assistance walk with assistance use a cane ability to climb steps?  yes do you drive?  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 Alcohol abuse Mother   . Alcohol abuse Father   . Cancer Sister   . Suicidality Sister    Social History   Social History  . Marital Status: Single    Spouse Name: N/A  . Number of Children: N/A  . Years of Education: N/A   Social History Main Topics  . Smoking status: Current Every Day Smoker  . Smokeless tobacco: None  . Alcohol Use: No  . Drug Use: No  . Sexual Activity: Not Asked   Other Topics Concern  . None   Social History Narrative   History reviewed. No pertinent past surgical history. Past Medical History  Diagnosis Date  . Hypertension   . Order #: [id number]    Accession #: [id number]    BP 146/84 mmHg  Pulse 79  Resp 14  SpO2 96%  Opioid Risk Score:   Fall Risk Score:  `1  Depression screen PHQ 2/9  Depression screen [facility] 2/9 12/25/2015 09/18/2015 09/11/2015 09/11/2015 04/07/2015  Decreased Interest 2 1 0 0 3  Down, Depressed, Hopeless 3 3 0 0 3  PHQ - 2 Score 5 4 0 0 6  Altered sleeping 3 3 - - 3  Tired, decreased energy 3 3 - - 2  Change in appetite 2 3 - - 3  Feeling bad or failure about yourself  2 2 - - 3  Trouble concentrating 1 1 - - 2  Moving slowly or fidgety/restless 2 1 - - 3  Suicidal thoughts 0 1 - - 3  PHQ-9 Score 18 18 - - 25  Difficult doing work/chores Somewhat difficult - - - -     Review of Systems  HENT: Positive for ear pain.        Objective:   Physical Exam  Constitutional: She is oriented to person, place, and time. She appears well-developed and well-nourished.  HENT:  Head: Normocephalic and atraumatic.  Eyes: Conjunctivae and EOM are normal. Pupils are equal, round, and reactive to light.  Neck: Normal range of motion.  Musculoskeletal:       Left shoulder: Normal.       Right  elbow: Normal.      Left elbow: Normal.       Right wrist: Normal.       Left wrist: Normal.       Right hip: Normal.       Left hip: Normal.       Right knee: Normal.       Left knee: Normal.       Right ankle: Normal.       Left ankle: Normal.       Cervical back: She exhibits decreased range of motion.       Thoracic back: She exhibits decreased range of motion. She exhibits no deformity.  Right shoulder positive impingement sign there is pain over the acromioclavicular joint as well. There is tenderness to palpation over bilateral trapezius area as well as cervical paraspinal muscles.  Cervical range of motion 75% flexion and extension, 50% lateral bending  Neurological: She is alert and oriented to person, place, and time.  Psychiatric: She has a normal mood and affect.  Nursing note and vitals reviewed.         Assessment & Plan:  1. Chronic neck and shoulder pain I think these are not connected.  Neck pain is related to cervical myofascial pain with some cervical spondylosis and cervical degenerative disc. No evidence of cervical radiculopathy  2. Right shoulder pain she has tenderness over the acromioclavicular joint, she also has positive impingement testing. We'll sent for x-ray of the shoulder. She may have multifactorial issues including rotator cuff. We discussed injection she does not wish to pursue this until she clarifies her insurance status.   3. Lumbar postlaminectomy syndrome with chronic right lumbar radiculopathy. She gets good relief with her gabapentin.Continue 600 mg 3 times a day  4. Chronic pain syndrome multifactorial listed above. Currently on the following regimen Tramadol 50 twice a day, Recommend increase to 3 times a day, she is on Paxil, avoid high-dose tramadol Tylenol with codeine No. 3 one tablet 3 times a day. In actuality she takes it more than that. We'll check urine drug screen. If this is appropriate I would discontinue Tylenol 3 and  start Tylenol 4 one tablet 3 times a day  Can see patient back in one month for shoulder injection  Otherwise will be placed on an every 3 month visit schedule with nurse practitioner

## 2015-12-29 ENCOUNTER — Telehealth: Payer: Self-pay

## 2015-12-29 DIAGNOSIS — M25551 Pain in right hip: Secondary | ICD-10-CM

## 2015-12-29 DIAGNOSIS — M545 Low back pain: Secondary | ICD-10-CM

## 2015-12-29 DIAGNOSIS — M25511 Pain in right shoulder: Secondary | ICD-10-CM

## 2015-12-29 DIAGNOSIS — M25512 Pain in left shoulder: Secondary | ICD-10-CM

## 2015-12-29 MED ORDER — TRAMADOL HCL 50 MG PO TABS: 50.0000 mg | ORAL_TABLET | Freq: Three times a day (TID) | ORAL | Status: DC

## 2015-12-29 NOTE — Telephone Encounter (Signed)
Pt is requesting a refill on Tramadol and her Tylenol #4. Tramadol 50 mg TID per last OV note will be called in. UDS results have not come back to determine Tylenol #4 script. Awaiting UDS results for Tylenol #4 script. Will make pt aware when they come in.

## 2015-12-31 ENCOUNTER — Telehealth: Payer: Self-pay | Admitting: *Deleted

## 2015-12-31 LAB — TOXASSURE SELECT,+ANTIDEPR,UR: PDF: 0

## 2015-12-31 LAB — 6-ACETYLMORPHINE,TOXASSURE ADD
6-ACETYLMORPHINE: NEGATIVE
6-acetylmorphine: NOT DETECTED ng/mg creat

## 2015-12-31 MED ORDER — ACETAMINOPHEN-CODEINE #4 300-60 MG PO TABS: 1.0000 | ORAL_TABLET | Freq: Three times a day (TID) | ORAL | Status: DC | PRN

## 2015-12-31 NOTE — Telephone Encounter (Signed)
Told her to bring the tyl #3 back to next appt.

## 2015-12-31 NOTE — Telephone Encounter (Signed)
[facility]

## 2015-12-31 NOTE — Telephone Encounter (Signed)
error 

## 2015-12-31 NOTE — Telephone Encounter (Signed)
UDS consistent.  Per Dr Wynn BankerKirsteins not we will call in Tylenol #4 1 tid #90.  I left message on VM to call office. No name on VM so no further info left.  We need to find out where she wants the medication called in to.  I am not sure [facility] carries controlled substances.

## 2016-01-02 NOTE — Progress Notes (Signed)
Urine drug screen for this encounter is consistent for prescribed medication 

## 2016-01-15 ENCOUNTER — Ambulatory Visit: Payer: Self-pay | Attending: Family Medicine | Admitting: Family Medicine

## 2016-01-15 ENCOUNTER — Encounter: Payer: Self-pay | Admitting: Clinical

## 2016-01-15 ENCOUNTER — Encounter: Payer: Self-pay | Admitting: Family Medicine

## 2016-01-15 VITALS — BP 155/93 | HR 75 | Temp 98.7°F | Resp 16 | Ht 62.0 in | Wt 152.0 lb

## 2016-01-15 DIAGNOSIS — F411 Generalized anxiety disorder: Secondary | ICD-10-CM | POA: Insufficient documentation

## 2016-01-15 DIAGNOSIS — M545 Low back pain: Secondary | ICD-10-CM

## 2016-01-15 DIAGNOSIS — M25512 Pain in left shoulder: Secondary | ICD-10-CM

## 2016-01-15 DIAGNOSIS — M25511 Pain in right shoulder: Secondary | ICD-10-CM

## 2016-01-15 DIAGNOSIS — F32A Depression, unspecified: Secondary | ICD-10-CM

## 2016-01-15 DIAGNOSIS — L709 Acne, unspecified: Secondary | ICD-10-CM

## 2016-01-15 DIAGNOSIS — F329 Major depressive disorder, single episode, unspecified: Secondary | ICD-10-CM

## 2016-01-15 DIAGNOSIS — I10 Essential (primary) hypertension: Secondary | ICD-10-CM

## 2016-01-15 MED ORDER — BUSPIRONE HCL 5 MG PO TABS: 5.0000 mg | ORAL_TABLET | Freq: Two times a day (BID) | ORAL | Status: DC

## 2016-01-15 MED ORDER — PAROXETINE HCL 20 MG PO TABS: 30.0000 mg | ORAL_TABLET | Freq: Every day | ORAL | Status: DC

## 2016-01-15 MED ORDER — LIDOCAINE 5 % EX PTCH: 1.0000 | MEDICATED_PATCH | CUTANEOUS | Status: DC

## 2016-01-15 MED ORDER — HYDROCHLOROTHIAZIDE 25 MG PO TABS: 25.0000 mg | ORAL_TABLET | Freq: Every day | ORAL | Status: AC

## 2016-01-15 MED ORDER — NABUMETONE 500 MG PO TABS: 500.0000 mg | ORAL_TABLET | Freq: Every day | ORAL | Status: DC

## 2016-01-15 MED ORDER — TRETINOIN 0.1 % EX CREA: TOPICAL_CREAM | Freq: Every day | CUTANEOUS | Status: DC

## 2016-01-15 MED ORDER — TRETINOIN 0.1 % EX CREA: TOPICAL_CREAM | Freq: Every day | CUTANEOUS | Status: AC

## 2016-01-15 MED FILL — TRETINOIN 0.1% CREAM: 0.1 | 15 days supply | Qty: 45 | Fill #0

## 2016-01-15 MED FILL — PARoxetine HCL 20 MG TABS: 20 | 40 days supply | Qty: 60 | Fill #0

## 2016-01-15 MED FILL — NABUMETONE 500 MG TABLET: 500 | 30 days supply | Qty: 60 | Fill #0

## 2016-01-15 MED FILL — busPIRone HCL 5 MG TABS: 5 | 30 days supply | Qty: 60 | Fill #0

## 2016-01-15 MED FILL — ?HYDROCHLOROTHIAZIDE 25 MG: 25 MG | 30 days supply | Qty: 30 | Fill #0

## 2016-01-15 MED FILL — traMADol HCL 50 MG TABS: 50 | 30 days supply | Qty: 90 | Fill #0

## 2016-01-15 NOTE — Progress Notes (Signed)
[facility] 2[facility] 01/15/2016 12/25/2015 09/18/2015 09/11/2015 09/11/2015  Decreased Interest 2 2 1  0 0  Down, Depressed, Hopeless 2 3 3  0 0  PHQ - 2 Score 4 5 4  0 0  Altered sleeping 3 3 3  - -  Tired, decreased energy 2 3 3  - -  Change in appetite 2 2 3  - -  Feeling bad or failure about yourself  0 2 2 - -  Trouble concentrating 0 1 1 - -  Moving slowly or fidgety/restless 0 2 1 - -  Suicidal thoughts 0 0 1 - -  PHQ-9 Score 11 18 18  - -  Difficult doing work/chores - Somewhat difficult - - -    GAD 7 : Generalized Anxiety Score 01/15/2016  Nervous, Anxious, on Edge 3  Control/stop worrying 3  Worry too much - different things 3  Trouble relaxing 3  Restless 2  Easily annoyed or irritable 3  Afraid - awful might happen 1  Total GAD 7 Score 18

## 2016-01-15 NOTE — Assessment & Plan Note (Signed)
A: HTN BP above goal P: Increase HCZT to 25 mg daily Will need BMP with GFR to monitor sodium and renal function at f/u

## 2016-01-15 NOTE — Progress Notes (Signed)
Patient ID: Cathy SanteeClara L Few, female   DOB: 12/04/1960, 55 y.o.   MRN: [id number]

## 2016-01-15 NOTE — Progress Notes (Signed)
Subjective:  Patient ID: Cathy James, female    DOB: 11-Sep-1961  Age: 55 y.o. MRN: [id number]  CC: Back Pain   HPI Cathy James presents for    1. Chronic low back pain: since 2005. 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 Went to PT, tried medications. Had low back surgery in 11/2013, L4 and L5.  Has low back pain that radiates to R lateral gluteal pain and sensation of swelling and hurts. Pain radiates down anterior thigh to knee. Requesting lidocaine patch Rx as this helped quite a bit. She has established with chronic pain management and is prescribed tylenol #4 and tramadol.  2. Anxiety: chronic with depression. Exacerbated by financial stress and stress with her adult children. Her daughter had a recent suicide attempt. Patient feels anxious most days with panic attacks about every other day. She gets pressure on her throat and chest.   3. HTN; taking HCTZ 12.5 mg daily. Smoking. Argued with her son this AM. No HA, CP, vision changes or SOB.   4. Adult acne: request retin-A refill.   Social History  Substance Use Topics  . Smoking status: Current Every Day Smoker  . Smokeless tobacco: Not on file  . Alcohol Use: No    Outpatient Prescriptions Prior to Visit  Medication Sig Dispense Refill  . acetaminophen-codeine (TYLENOL #4) 300-60 MG tablet Take 1 tablet by mouth every 8 (eight) hours as needed for moderate pain. 90 tablet 0  . calcium carbonate (OS-CAL) 600 MG TABS tablet Take 600 mg by mouth 2 (two) times daily with a meal.    . cholecalciferol (VITAMIN D) 1000 UNITS tablet Take 1,000 Units by mouth daily.    . fluconazole (DIFLUCAN) 150 MG tablet Take 1 tablet (150 mg total) by mouth once. 1 tablet 0  . fluocinonide (LIDEX) 0.05 % external solution APPLY 1 APPLICATION TOPICALLY 2 TIMES DAILY. 60 mL 0  . gabapentin (NEURONTIN) 300 MG capsule Take 2 capsules (600 mg total) by mouth 3 (three) times daily. 180 capsule 5  . hydrochlorothiazide  (MICROZIDE) 12.5 MG capsule Take 1 capsule (12.5 mg total) by mouth daily. 30 capsule 11  . ketoconazole (NIZORAL) 2 % cream Apply 1 application topically daily. 60 g 2  . methocarbamol (ROBAXIN) 750 MG tablet TAKE 1 TABLET BY MOUTH EVERY 8 HOURS AS NEEDED FOR MUSCLE SPASMS. 60 tablet 1  . nabumetone (RELAFEN) 500 MG tablet TAKE 1-2 TABLETS BY MOUTH DAILY 60 tablet 0  . PARoxetine (PAXIL) 20 MG tablet Take 1 tablet (20 mg total) by mouth daily. 30 tablet 5  . pregabalin (LYRICA) 150 MG capsule Take 1 capsule (150 mg total) by mouth 2 (two) times daily. 60 capsule 3  . traMADol (ULTRAM) 50 MG tablet Take 1 tablet (50 mg total) by mouth every 8 (eight) hours. 90 tablet 0  . traZODone (DESYREL) 50 MG tablet Take 0.5-1 tablets (25-50 mg total) by mouth at bedtime as needed for sleep. 30 tablet 3  . tretinoin (RETIN-A) 0.1 % cream Apply topically at bedtime.    . triamcinolone cream (KENALOG) 0.1 % APPLY 1 APPLICATION TOPICALLY 2 TIMES DAILY. 30 g 2  . vitamin B-12 (CYANOCOBALAMIN) 1000 MCG tablet Take 1,000 mcg by mouth daily.    . vitamin C (ASCORBIC ACID) 500 MG tablet Take 500 mg by mouth daily.    . vitamin E 100 UNIT capsule Take by mouth daily.     No facility-administered medications prior to  visit.    ROS Review of Systems  Constitutional: Positive for fatigue. Negative for fever and chills.  Eyes: Negative for visual disturbance.  Respiratory: Negative for shortness of breath.   Cardiovascular: Negative for chest pain.  Gastrointestinal: Negative for abdominal pain and blood in stool.  Genitourinary: Positive for vaginal bleeding.  Musculoskeletal: Positive for arthralgias and neck pain. Negative for back pain.  Skin: Negative for rash.  Allergic/Immunologic: Negative for immunocompromised state.  Hematological: Negative for adenopathy. Does not bruise/bleed easily.  Psychiatric/Behavioral: Positive for dysphoric mood. Negative for suicidal ideas. The patient is nervous/anxious.      Objective:  BP 155/93 mmHg  Pulse 75  Temp(Src) 98.7 F (37.1 C) (Oral)  Resp 16  Ht  (1.575 m)  Wt 152 lb (68.947 kg)  BMI 27.79 kg/m2  SpO2 96%  BP/Weight 01/15/2016 12/25/2015 09/18/2015  Systolic BP 155 146 136  Diastolic BP 93 84 85  Wt. (Lbs) 152 - 157  BMI 27.79 - 28.71   Physical Exam  Constitutional: She is oriented to person, place, and time. She appears well-developed and well-nourished. No distress.  HENT:  Head: Normocephalic and atraumatic.  Cardiovascular: Normal rate, regular rhythm, normal heart sounds and intact distal pulses.   Pulmonary/Chest: Effort normal and breath sounds normal.  Musculoskeletal: She exhibits no edema.  Neurological: She is alert and oriented to person, place, and time.  Skin: Skin is warm and dry. No rash noted.  Psychiatric: She has a normal mood and affect.   Depression screen [facility] 2/9 01/15/2016 12/25/2015 09/18/2015 09/11/2015 09/11/2015  Decreased Interest 0 0  Down, Depressed, Hopeless 0 0  PHQ - 2 Score 0 0  Altered sleeping - -  Tired, decreased energy - -  Change in appetite - -  Feeling bad or failure about yourself  0 2 2 - -  Trouble concentrating 0 1 1 - -  Moving slowly or fidgety/restless 0 2 1 - -  Suicidal thoughts 0 0 1 - -  PHQ-9 Score - -  Difficult doing work/chores - Somewhat difficult - - -    GAD 7 : Generalized Anxiety Score 01/15/2016  Nervous, Anxious, on Edge 3  Control/stop worrying 3  Worry too much - different things 3  Trouble relaxing 3  Restless 2  Easily annoyed or irritable 3  Afraid - awful might happen 1  Total GAD 7 Score 18      Assessment & Plan:   There are no diagnoses linked to this encounter. Cathy James was seen today for back pain.  Diagnoses and all orders for this visit:  Midline low back pain, with sciatica presence unspecified -     lidocaine (LIDODERM) 5 %; Place 1 patch onto the skin daily. Remove & Discard patch  within 12 hours or as directed by MD -     Discontinue: nabumetone (RELAFEN) 500 MG tablet; Take 1-2 tablets (500-1,000 mg total) by mouth daily. -     nabumetone (RELAFEN) 500 MG tablet; Take 1-2 tablets (500-1,000 mg total) by mouth daily.  Bilateral shoulder pain -     lidocaine (LIDODERM) 5 %; Place 1 patch onto the skin daily. Remove & Discard patch within 12 hours or as directed by MD -     Discontinue: nabumetone (RELAFEN) 500 MG tablet; Take 1-2 tablets (500-1,000 mg total) by mouth daily. -     nabumetone (  RELAFEN) 500 MG tablet; Take 1-2 tablets (500-1,000 mg total) by mouth daily.  Adult acne -     Discontinue: tretinoin (RETIN-A) 0.1 % cream; Apply topically at bedtime. -     tretinoin (RETIN-A) 0.1 % cream; Apply topically at bedtime.  Depression -     Discontinue: PARoxetine (PAXIL) 20 MG tablet; Take 1.5 tablets (30 mg total) by mouth daily. -     PARoxetine (PAXIL) 20 MG tablet; Take 1.5 tablets (30 mg total) by mouth daily.  Anxiety state -     Discontinue: PARoxetine (PAXIL) 20 MG tablet; Take 1.5 tablets (30 mg total) by mouth daily. -     Discontinue: busPIRone (BUSPAR) 5 MG tablet; Take 1 tablet (5 mg total) by mouth 2 (two) times daily. -     PARoxetine (PAXIL) 20 MG tablet; Take 1.5 tablets (30 mg total) by mouth daily. -     busPIRone (BUSPAR) 5 MG tablet; Take 1 tablet (5 mg total) by mouth 2 (two) times daily.  Essential hypertension -     hydrochlorothiazide (HYDRODIURIL) 25 MG tablet; Take 1 tablet (25 mg total) by mouth daily.   Meds ordered this encounter  Medications  . DISCONTD: tretinoin (RETIN-A) 0.1 % cream    Sig: Apply topically at bedtime.    Dispense:  45 g    Refill:  2  . lidocaine (LIDODERM) 5 %    Sig: Place 1 patch onto the skin daily. Remove & Discard patch within 12 hours or as directed by MD    Dispense:  30 patch    Refill:  2  . DISCONTD: PARoxetine (PAXIL) 20 MG tablet    Sig: Take 1.5 tablets (30 mg total) by mouth daily.     Dispense:  60 tablet    Refill:  2  . DISCONTD: busPIRone (BUSPAR) 5 MG tablet    Sig: Take 1 tablet (5 mg total) by mouth 2 (two) times daily.    Dispense:  60 tablet    Refill:  3  . DISCONTD: nabumetone (RELAFEN) 500 MG tablet    Sig: Take 1-2 tablets (500-1,000 mg total) by mouth daily.    Dispense:  60 tablet    Refill:  5  . PARoxetine (PAXIL) 20 MG tablet    Sig: Take 1.5 tablets (30 mg total) by mouth daily.    Dispense:  60 tablet    Refill:  2  . tretinoin (RETIN-A) 0.1 % cream    Sig: Apply topically at bedtime.    Dispense:  45 g    Refill:  2  . nabumetone (RELAFEN) 500 MG tablet    Sig: Take 1-2 tablets (500-1,000 mg total) by mouth daily.    Dispense:  60 tablet    Refill:  5  . busPIRone (BUSPAR) 5 MG tablet    Sig: Take 1 tablet (5 mg total) by mouth 2 (two) times daily.    Dispense:  60 tablet    Refill:  3  . hydrochlorothiazide (HYDRODIURIL) 25 MG tablet    Sig: Take 1 tablet (25 mg total) by mouth daily.    Dispense:  90 tablet    Refill:  3    Follow-up: No Follow-up on file.   Dessa PhiJosalyn Vilas Edgerly MD

## 2016-01-15 NOTE — Assessment & Plan Note (Signed)
Add buspar 5 mg daily  for chronic anxiety Increase paxil from 20 to 30 mg daily

## 2016-01-15 NOTE — Progress Notes (Signed)
F/U leg pain and hip  Pain scale #4 Anxiety attack getting worst  Tobacco user 1pp day  No suicidal thought in the past two weeks

## 2016-01-15 NOTE — Patient Instructions (Addendum)
Cathy James was seen today for back pain.  Diagnoses and all orders for this visit:  Midline low back pain, with sciatica presence unspecified -     lidocaine (LIDODERM) 5 %; Place 1 patch onto the skin daily. Remove & Discard patch within 12 hours or as directed by MD -     Discontinue: nabumetone (RELAFEN) 500 MG tablet; Take 1-2 tablets (500-1,000 mg total) by mouth daily. -     nabumetone (RELAFEN) 500 MG tablet; Take 1-2 tablets (500-1,000 mg total) by mouth daily.  Bilateral shoulder pain -     lidocaine (LIDODERM) 5 %; Place 1 patch onto the skin daily. Remove & Discard patch within 12 hours or as directed by MD -     Discontinue: nabumetone (RELAFEN) 500 MG tablet; Take 1-2 tablets (500-1,000 mg total) by mouth daily. -     nabumetone (RELAFEN) 500 MG tablet; Take 1-2 tablets (500-1,000 mg total) by mouth daily.  Adult acne -     Discontinue: tretinoin (RETIN-A) 0.1 % cream; Apply topically at bedtime. -     tretinoin (RETIN-A) 0.1 % cream; Apply topically at bedtime.  Depression -     Discontinue: PARoxetine (PAXIL) 20 MG tablet; Take 1.5 tablets (30 mg total) by mouth daily. -     PARoxetine (PAXIL) 20 MG tablet; Take 1.5 tablets (30 mg total) by mouth daily.  Anxiety state -     Discontinue: PARoxetine (PAXIL) 20 MG tablet; Take 1.5 tablets (30 mg total) by mouth daily. -     Discontinue: busPIRone (BUSPAR) 5 MG tablet; Take 1 tablet (5 mg total) by mouth 2 (two) times daily. -     PARoxetine (PAXIL) 20 MG tablet; Take 1.5 tablets (30 mg total) by mouth daily. -     busPIRone (BUSPAR) 5 MG tablet; Take 1 tablet (5 mg total) by mouth 2 (two) times daily.  Essential hypertension -     hydrochlorothiazide (HYDRODIURIL) 25 MG tablet; Take 1 tablet (25 mg total) by mouth daily.    F/u in 4 weeks for anxiety and buspar follow up   Dr. Armen PickupFunches

## 2016-01-16 ENCOUNTER — Other Ambulatory Visit: Payer: Self-pay | Admitting: Family Medicine

## 2016-01-16 DIAGNOSIS — M25512 Pain in left shoulder: Secondary | ICD-10-CM

## 2016-01-16 DIAGNOSIS — M545 Low back pain: Secondary | ICD-10-CM

## 2016-01-16 DIAGNOSIS — M25511 Pain in right shoulder: Secondary | ICD-10-CM

## 2016-01-16 MED ORDER — LIDOCAINE 5 % EX PTCH: 1.0000 | MEDICATED_PATCH | CUTANEOUS | Status: DC

## 2016-01-16 MED FILL — LIDOCAINE PATCH 5%: 5 | 30 days supply | Qty: 30 | Fill #0

## 2016-02-02 ENCOUNTER — Other Ambulatory Visit: Payer: Self-pay | Admitting: Physical Medicine & Rehabilitation

## 2016-02-02 NOTE — Telephone Encounter (Signed)
Patient requesting refill, please advise if refill is appropriate .

## 2016-02-05 MED FILL — METHOCARBAMOL 750 MG TABLET: 750 | 20 days supply | Qty: 60 | Fill #1

## 2016-02-05 MED FILL — GABAPENTIN 300 MG CAPSULE: 300 | 30 days supply | Qty: 180 | Fill #5

## 2016-02-06 ENCOUNTER — Other Ambulatory Visit: Payer: Self-pay | Admitting: Physical Medicine & Rehabilitation

## 2016-02-11 ENCOUNTER — Other Ambulatory Visit: Payer: Self-pay | Admitting: Physical Medicine & Rehabilitation

## 2016-02-11 NOTE — Telephone Encounter (Signed)
Received a refill request, noticed patient doesn't have a follow up appointment. Placed a call to Ms. Witherell. No answer.

## 2016-02-12 ENCOUNTER — Ambulatory Visit: Payer: Self-pay | Admitting: Family Medicine

## 2016-02-25 MED FILL — traMADol HCL 50 MG TABS: 50 | 30 days supply | Qty: 60 | Fill #2

## 2016-02-25 MED FILL — NABUMETONE 500 MG TABLET: 500 | 30 days supply | Qty: 60 | Fill #1

## 2016-02-25 MED FILL — PARoxetine HCL 20 MG TABS: 20 | 40 days supply | Qty: 60 | Fill #1

## 2016-02-25 MED FILL — ?HYDROCHLOROTHIAZIDE 25 MG: 25 MG | 30 days supply | Qty: 30 | Fill #1

## 2016-02-25 MED FILL — METHOCARBAMOL 750 MG TABLET: 750 | 20 days supply | Qty: 60 | Fill #2

## 2016-03-01 ENCOUNTER — Other Ambulatory Visit: Payer: Self-pay | Admitting: Physical Medicine & Rehabilitation

## 2016-04-06 ENCOUNTER — Telehealth: Payer: Self-pay | Admitting: Physical Medicine & Rehabilitation

## 2016-04-06 ENCOUNTER — Other Ambulatory Visit: Payer: Self-pay | Admitting: Family Medicine

## 2016-04-06 MED FILL — NABUMETONE 500 MG TABLET: 500 | 30 days supply | Qty: 60 | Fill #2

## 2016-04-06 MED FILL — busPIRone HCL 5 MG TABS: 5 | 30 days supply | Qty: 60 | Fill #1

## 2016-04-06 MED FILL — HYDROCHLOROTHIAZIDE 25 MG T: 25 | 30 days supply | Qty: 30 | Fill #2

## 2016-04-06 MED FILL — PARoxetine HCL 20 MG TABS: 20 | 40 days supply | Qty: 60 | Fill #2

## 2016-04-06 NOTE — Telephone Encounter (Signed)
Patient requesting a refill on Tylenol #4 and Tramadol.  Please call her at [phone].

## 2016-04-07 MED ORDER — ACETAMINOPHEN-CODEINE #4 300-60 MG PO TABS: ORAL_TABLET | ORAL | Status: DC

## 2016-04-07 MED FILL — METHOCARBAMOL 750 MG TABLET: 750 | 30 days supply | Qty: 90 | Fill #0

## 2016-04-07 MED FILL — GABAPENTIN 300 MG CAPSULE: 300 | 36 days supply | Qty: 180 | Fill #0

## 2016-04-07 NOTE — Telephone Encounter (Signed)
Tyl # 4 last filled 03/06/16 and Tramadol was filled 02/25/16 but this medication was last  prescribed by Funches MD.  Tylenol # 4 will be filled for one month,.  She sees Dr Wynn BankerKirsteins 04/15/16

## 2016-04-15 ENCOUNTER — Ambulatory Visit: Payer: Self-pay | Admitting: Physical Medicine & Rehabilitation

## 2016-04-15 ENCOUNTER — Ambulatory Visit: Payer: Self-pay

## 2016-04-28 ENCOUNTER — Other Ambulatory Visit: Payer: Self-pay | Admitting: Family Medicine

## 2016-04-28 DIAGNOSIS — M25512 Pain in left shoulder: Secondary | ICD-10-CM

## 2016-04-28 DIAGNOSIS — M545 Low back pain: Secondary | ICD-10-CM

## 2016-04-28 DIAGNOSIS — M25511 Pain in right shoulder: Secondary | ICD-10-CM

## 2016-04-28 DIAGNOSIS — M25551 Pain in right hip: Secondary | ICD-10-CM

## 2016-04-28 MED FILL — LIDOCAINE PATCH 5%: 5 | 30 days supply | Qty: 30 | Fill #1

## 2016-04-28 MED FILL — FLUOCINONIDE 0.05% SOLUTION: 0.05 | 7 days supply | Qty: 60 | Fill #1

## 2016-04-28 MED FILL — KETOCONAZOLE 2% CREAM: 2 | 30 days supply | Qty: 60 | Fill #1

## 2016-04-28 MED FILL — TRETINOIN 0.1% CREAM: 0.1 | 15 days supply | Qty: 45 | Fill #1

## 2016-04-28 MED FILL — TRIAMCINOLONE 0.1% CREAM: 0.1 | 15 days supply | Qty: 30 | Fill #1

## 2016-04-28 NOTE — Telephone Encounter (Signed)
Please ask patient to have tramadol and tylenol #4 filled by her pain management doctor, Dr. Doroteo BradfordKirstein and his team.   I have routed this note to Dr. Doroteo BradfordKirstein to check that this is ok with him.

## 2016-04-29 NOTE — Telephone Encounter (Signed)
Attempted to call patient on home phone and cell phone no answer or voicemail.

## 2016-04-29 NOTE — Telephone Encounter (Signed)
Patient will need clinic visit with Cathy James to refill these

## 2016-04-29 NOTE — Telephone Encounter (Signed)
Please schedule patient to be seen by Dr. Wynn BankerKirsteins

## 2016-04-29 NOTE — Telephone Encounter (Signed)
Appt made

## 2016-05-06 ENCOUNTER — Encounter: Payer: Self-pay | Attending: Physical Medicine & Rehabilitation

## 2016-05-06 ENCOUNTER — Encounter: Payer: Self-pay | Admitting: Physical Medicine & Rehabilitation

## 2016-05-06 ENCOUNTER — Ambulatory Visit (HOSPITAL_BASED_OUTPATIENT_CLINIC_OR_DEPARTMENT_OTHER): Payer: Self-pay | Admitting: Physical Medicine & Rehabilitation

## 2016-05-06 VITALS — BP 155/89 | HR 103 | Temp 98.5°F

## 2016-05-06 DIAGNOSIS — M549 Dorsalgia, unspecified: Secondary | ICD-10-CM

## 2016-05-06 DIAGNOSIS — M25551 Pain in right hip: Secondary | ICD-10-CM

## 2016-05-06 DIAGNOSIS — M961 Postlaminectomy syndrome, not elsewhere classified: Secondary | ICD-10-CM | POA: Insufficient documentation

## 2016-05-06 DIAGNOSIS — M545 Low back pain: Secondary | ICD-10-CM

## 2016-05-06 DIAGNOSIS — G8929 Other chronic pain: Secondary | ICD-10-CM

## 2016-05-06 DIAGNOSIS — Z9889 Other specified postprocedural states: Secondary | ICD-10-CM | POA: Insufficient documentation

## 2016-05-06 DIAGNOSIS — G894 Chronic pain syndrome: Secondary | ICD-10-CM | POA: Insufficient documentation

## 2016-05-06 DIAGNOSIS — Z79899 Other long term (current) drug therapy: Secondary | ICD-10-CM

## 2016-05-06 DIAGNOSIS — M25511 Pain in right shoulder: Secondary | ICD-10-CM

## 2016-05-06 DIAGNOSIS — Z5181 Encounter for therapeutic drug level monitoring: Secondary | ICD-10-CM

## 2016-05-06 DIAGNOSIS — M542 Cervicalgia: Secondary | ICD-10-CM

## 2016-05-06 DIAGNOSIS — M25512 Pain in left shoulder: Secondary | ICD-10-CM

## 2016-05-06 MED ORDER — ACETAMINOPHEN-CODEINE #4 300-60 MG PO TABS: ORAL_TABLET | ORAL | 2 refills | Status: AC

## 2016-05-06 MED ORDER — TRAMADOL HCL 50 MG PO TABS: 50.0000 mg | ORAL_TABLET | Freq: Three times a day (TID) | ORAL | 2 refills | Status: AC

## 2016-05-06 NOTE — Patient Instructions (Addendum)
Get shoulder xray Plan injection  Cont current meds Back Exercises If you have pain in your back, do these exercises 2-3 times each day or as told by your doctor. When the pain goes away, do the exercises once each day, but repeat the steps more times for each exercise (do more repetitions). If you do not have pain in your back, do these exercises once each day or as told by your doctor. EXERCISES Single Knee to Chest Do these steps 3-5 times in a row for each leg: 1. Lie on your back on a firm bed or the floor with your legs stretched out. 2. Bring one knee to your chest. 3. Hold your knee to your chest by grabbing your knee or thigh. 4. Pull on your knee until you feel a gentle stretch in your lower back. 5. Keep doing the stretch for 10-30 seconds. 6. Slowly let go of your leg and straighten it. Pelvic Tilt Do these steps 5-10 times in a row: 1. Lie on your back on a firm bed or the floor with your legs stretched out. 2. Bend your knees so they point up to the ceiling. Your feet should be flat on the floor. 3. Tighten your lower belly (abdomen) muscles to press your lower back against the floor. This will make your tailbone point up to the ceiling instead of pointing down to your feet or the floor. 4. Stay in this position for 5-10 seconds while you gently tighten your muscles and breathe evenly. Cat-Cow Do these steps until your lower back bends more easily: 1. Get on your hands and knees on a firm surface. Keep your hands under your shoulders, and keep your knees under your hips. You may put padding under your knees. 2. Let your head hang down, and make your tailbone point down to the floor so your lower back is round like the back of a cat. 3. Stay in this position for 5 seconds. 4. Slowly lift your head and make your tailbone point up to the ceiling so your back hangs low (sags) like the back of a cow. 5. Stay in this position for 5 seconds. Press-Ups Do these steps 5-10 times in  a row: 1. Lie on your belly (face-down) on the floor. 2. Place your hands near your head, about shoulder-width apart. 3. While you keep your back relaxed and keep your hips on the floor, slowly straighten your arms to raise the top half of your body and lift your shoulders. Do not use your back muscles. To make yourself more comfortable, you may change where you place your hands. 4. Stay in this position for 5 seconds. 5. Slowly return to lying flat on the floor. Bridges Do these steps 10 times in a row: 1. Lie on your back on a firm surface. 2. Bend your knees so they point up to the ceiling. Your feet should be flat on the floor. 3. Tighten your butt muscles and lift your butt off of the floor until your waist is almost as high as your knees. If you do not feel the muscles working in your butt and the back of your thighs, slide your feet 1-2 inches farther away from your butt. 4. Stay in this position for 3-5 seconds. 5. Slowly lower your butt to the floor, and let your butt muscles relax. If this exercise is too easy, try doing it with your arms crossed over your chest. Belly Crunches Do these steps 5-10 times in a row:  1. Lie on your back on a firm bed or the floor with your legs stretched out. 2. Bend your knees so they point up to the ceiling. Your feet should be flat on the floor. 3. Cross your arms over your chest. 4. Tip your chin a little bit toward your chest but do not bend your neck. 5. Tighten your belly muscles and slowly raise your chest just enough to lift your shoulder blades a tiny bit off of the floor. 6. Slowly lower your chest and your head to the floor. Back Lifts Do these steps 5-10 times in a row: 1. Lie on your belly (face-down) with your arms at your sides, and rest your forehead on the floor. 2. Tighten the muscles in your legs and your butt. 3. Slowly lift your chest off of the floor while you keep your hips on the floor. Keep the back of your head in line with  the curve in your back. Look at the floor while you do this. 4. Stay in this position for 3-5 seconds. 5. Slowly lower your chest and your face to the floor. GET HELP IF:  Your back pain gets a lot worse when you do an exercise.  Your back pain does not lessen 2 hours after you exercise. If you have any of these problems, stop doing the exercises. Do not do them again unless your doctor says it is okay. GET HELP RIGHT AWAY IF:  You have sudden, very bad back pain. If this happens, stop doing the exercises. Do not do them again unless your doctor says it is okay.   This information is not intended to replace advice given to you by your health care provider. Make sure you discuss any questions you have with your health care provider.   Document Released: 10/09/2010 Document Revised: 05/28/2015 Document Reviewed: 10/31/2014 Elsevier Interactive Patient Education [employer]2016 [employer]vier Inc.

## 2016-05-06 NOTE — Progress Notes (Signed)
Subjective:    Patient ID: Cathy James, female    DOB: 01/09/1961, 55 y.o.   MRN: [id number]  HPI Pt without new issues Cont to have neck back and Right shoulder pain Meds help a little but no Tramadol rx recently  Pain Inventory Average Pain 5 Pain Right Now 5 My pain is sharp, burning, stabbing and aching  In the last 24 hours, has pain interfered with the following? General activity 7 Relation with others 7 Enjoyment of life 7 What TIME of day is your pain at its worst? 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  no  Physicians involved in your care Any changes since last visit?  no   Family History  Problem Relation Age of Onset  . Alcohol abuse Mother   . Alcohol abuse Father   . Cancer Sister   . Suicidality Sister    Social History   Social History  . Marital status: Single    Spouse name: N/A  . Number of children: N/A  . Years of education: N/A   Social History Main Topics  . Smoking status: Current Every Day Smoker  . Smokeless tobacco: None  . Alcohol use No  . Drug use: No  . Sexual activity: Not Asked   Other Topics Concern  . None   Social History Narrative  . None   No past surgical history on file. Past Medical History:  Diagnosis Date  . Hypertension   . Order #: [id number]    Accession #: [id number]    BP (!) 155/89 (BP Location: Left Arm, Patient Position: Sitting, Cuff Size: Normal)   Pulse (!) 103   Temp 98.5 F (36.9 C) (Oral)   SpO2 94%   Opioid Risk Score:   Fall Risk Score:  `1  Depression screen PHQ 2/9  Depression  [facility] 2/9 01/15/2016 12/25/2015 09/18/2015 09/11/2015 09/11/2015 04/07/2015  Decreased Interest 2 2 1  0 0 3  Down, Depressed, Hopeless 2 3 3  0 0 3  PHQ - 2 Score 4 5 4  0 0 6  Altered sleeping 3 3 3  - - 3  Tired, decreased energy 2 3 3  - - 2  Change in appetite 2 2 3  - - 3  Feeling bad or failure about yourself  0 2 2 - - 3  Trouble concentrating 0 1 1 - - 2  Moving slowly or fidgety/restless 0 2 1 - - 3  Suicidal thoughts 0 0 1 - - 3  PHQ-9 Score 11 18 18  - - 25  Difficult doing work/chores - Somewhat difficult - - - -    Review of Systems  Constitutional: Positive for diaphoresis.  Gastrointestinal: Positive for constipation.  All other systems reviewed and are negative.      Objective:   Physical Exam  Constitutional: She is oriented to person, place, and time. She appears well-developed and well-nourished.  HENT:  Head: Normocephalic and atraumatic.  Eyes: Conjunctivae are normal. Pupils are equal, round, and reactive to light.  Neck: Normal range of motion.  Neurological: She is alert and oriented to person, place, and time.  Psychiatric: She  has a normal mood and affect.  Nursing note and vitals reviewed. Tenerness Right AC area Pos impingement Right shoulder Neck ROM 75%, neg Spurlings        Assessment & Plan:  1. Chronic neck and shoulder pain I think these are not connected.  Neck pain is related to cervical myofascial pain with some cervical spondylosis and cervical degenerative disc. No evidence of cervical radiculopathy  2. Right shoulder pain she has tenderness over the acromioclavicular joint, she also has positive impingement testing. Patient did not follow through on x-ray of the shoulder.  She has not checked whether [facility] [employer] will pay for this. I think it may and have instructed her to talk to our clerical staff  We discussed that further diagnostic testing will help direct most effective care.    3. Lumbar postlaminectomy syndrome with  chronic right lumbar radiculopathy. History of L4-L5 laminectomy She gets good relief with her gabapentin.Continue 600 mg 3 times a day  4. Chronic pain syndrome multifactorial listed above. Currently on the following regimen Tramadol 50 twice a day, Recommend increase to 3 times a day, she is on Paxil, avoid high-dose tramadol  We'll Recheck urine drug screen. Cont Tylenol 4 one tablet 3 times a day  Can see patient back in one month for shoulder injection,Pt will  check ins benefits  Otherwise will be placed on an every 3 month visit schedule with nurse practitioner  Patient states that her regimen is not controlling all of her pain. We discussed that pain medications will only take away about one third of the pain at most.  We discussed other treatments including exercise

## 2016-05-11 MED FILL — GABAPENTIN 300 MG CAPSULE: 300 | 36 days supply | Qty: 180 | Fill #1

## 2016-05-11 MED FILL — ?PAROXETINE 20 MG TABLET: 20 MG | 30 days supply | Qty: 30 | Fill #4

## 2016-05-11 MED FILL — HYDROCHLOROTHIAZIDE 25 MG T: 25 | 30 days supply | Qty: 30 | Fill #3

## 2016-05-11 MED FILL — NABUMETONE 500 MG TABLET: 500 | 30 days supply | Qty: 60 | Fill #3

## 2016-05-11 MED FILL — METHOCARBAMOL 750 MG TABLET: 750 | 30 days supply | Qty: 90 | Fill #1

## 2016-05-11 MED FILL — busPIRone HCL 5 MG TABS: 5 | 30 days supply | Qty: 60 | Fill #2

## 2016-05-19 LAB — TOXASSURE SELECT,+ANTIDEPR,UR: PDF: 0

## 2016-05-19 LAB — 6-ACETYLMORPHINE,TOXASSURE ADD
6-ACETYLMORPHINE: NEGATIVE
6-ACETYLMORPHINE: NOT DETECTED ng/mg{creat}

## 2016-05-25 ENCOUNTER — Telehealth: Payer: Self-pay

## 2016-05-25 NOTE — Telephone Encounter (Signed)
discharge

## 2016-05-25 NOTE — Telephone Encounter (Signed)
Pt's UDS is positive for alcohol and Hydrocodone-which is not on her medication list. Please advise.

## 2016-05-27 NOTE — Telephone Encounter (Signed)
Per [facility]-Hydrocodone was not listed. Jacalyn LefevreEunice Thomas, NP assisted in research.

## 2016-05-28 NOTE — Telephone Encounter (Signed)
Historically rx'd hydrocodone in early 2016, but should not have been taking any of this old medication if that is where she got this from. Dr Wynn BankerKirsteins still agrees with discharge. Discharge letter mailed.

## 2016-06-14 ENCOUNTER — Telehealth: Payer: Self-pay | Admitting: Family Medicine

## 2016-06-14 NOTE — Telephone Encounter (Signed)
Pt. Called stating that she needs more dosage on triamcinolone cream (KENALOG) 0.1 % Pt. States that it is not enough to cover all her eczema. Please f/u with pt.

## 2016-06-15 MED ORDER — TRIAMCINOLONE ACETONIDE 0.1 % EX CREA: TOPICAL_CREAM | Freq: Two times a day (BID) | CUTANEOUS | 2 refills | Status: DC

## 2016-06-15 NOTE — Telephone Encounter (Signed)
Ordered bigger jar

## 2016-06-21 ENCOUNTER — Telehealth: Payer: Self-pay | Admitting: *Deleted

## 2016-06-21 NOTE — Telephone Encounter (Signed)
-----   Message from Erick ColaceAndrew E Kirsteins, MD sent at 06/16/2016 10:08 AM EDT ----- Discharge due to hydrocodone, present as well as high alcohol levels

## 2016-06-21 NOTE — Telephone Encounter (Signed)
This has been done.

## 2016-06-25 MED FILL — KETOCONAZOLE 2% CREAM: 2 | 30 days supply | Qty: 60 | Fill #2

## 2016-06-25 MED FILL — LIDOCAINE PATCH 5%: 5 | 30 days supply | Qty: 30 | Fill #2

## 2016-06-25 MED FILL — NABUMETONE 500 MG TABLET: 500 | 30 days supply | Qty: 60 | Fill #4 | Status: TO

## 2016-06-25 MED FILL — busPIRone HCL 5 MG TABS: 5 | 30 days supply | Qty: 60 | Fill #3

## 2016-06-25 MED FILL — METHOCARBAMOL 750 MG TABLET: 750 | 30 days supply | Qty: 90 | Fill #2

## 2016-06-25 MED FILL — GABAPENTIN 300 MG CAPSULE: 300 | 36 days supply | Qty: 180 | Fill #2 | Status: TO

## 2016-06-25 MED FILL — PARoxetine HCL 20 MG TABS: 20 | 30 days supply | Qty: 30 | Fill #5

## 2016-06-25 MED FILL — TRIAMCINOLONE 0.1% CREAM: 0.1 | 25 days supply | Qty: 454 | Fill #0

## 2016-06-25 MED FILL — HYDROCHLOROTHIAZIDE 25 MG T: 25 | 30 days supply | Qty: 30 | Fill #4 | Status: TO

## 2016-08-06 ENCOUNTER — Ambulatory Visit: Payer: Self-pay | Admitting: Registered Nurse

## 2016-08-06 ENCOUNTER — Other Ambulatory Visit: Payer: Self-pay | Admitting: Family Medicine

## 2016-08-19 ENCOUNTER — Other Ambulatory Visit: Payer: Self-pay | Admitting: Family Medicine

## 2016-08-19 DIAGNOSIS — F32A Depression, unspecified: Secondary | ICD-10-CM

## 2016-08-19 DIAGNOSIS — F329 Major depressive disorder, single episode, unspecified: Secondary | ICD-10-CM

## 2016-08-19 MED FILL — METHOCARBAMOL 750 MG TABLET: 750 | 30 days supply | Qty: 90 | Fill #0

## 2016-08-25 ENCOUNTER — Telehealth: Payer: Self-pay | Admitting: Family Medicine

## 2016-08-25 ENCOUNTER — Other Ambulatory Visit: Payer: Self-pay | Admitting: Physical Medicine & Rehabilitation

## 2016-08-25 DIAGNOSIS — G8929 Other chronic pain: Secondary | ICD-10-CM

## 2016-08-25 DIAGNOSIS — M25512 Pain in left shoulder: Secondary | ICD-10-CM

## 2016-08-25 DIAGNOSIS — M545 Low back pain, unspecified: Secondary | ICD-10-CM

## 2016-08-25 DIAGNOSIS — M961 Postlaminectomy syndrome, not elsewhere classified: Secondary | ICD-10-CM

## 2016-08-25 DIAGNOSIS — M25551 Pain in right hip: Secondary | ICD-10-CM

## 2016-08-25 DIAGNOSIS — M25511 Pain in right shoulder: Principal | ICD-10-CM

## 2016-08-25 NOTE — Telephone Encounter (Signed)
Please call patient  Upon review of chart, I see that she was discharged from pain management due to high alcohol level of un-prescribed hydrocodone on screen  I will not refill tylenol #3 or any other controlled substance   She is advised to f/u to discuss pain management  Options

## 2016-08-26 ENCOUNTER — Other Ambulatory Visit: Payer: Self-pay | Admitting: Family Medicine

## 2016-08-26 DIAGNOSIS — B356 Tinea cruris: Secondary | ICD-10-CM

## 2016-08-26 NOTE — Telephone Encounter (Signed)
Pt was called and there was no VM set up to leave a message. 

## 2016-09-07 ENCOUNTER — Other Ambulatory Visit: Payer: Self-pay | Admitting: Family Medicine

## 2016-09-07 DIAGNOSIS — M25511 Pain in right shoulder: Secondary | ICD-10-CM

## 2016-09-07 DIAGNOSIS — M25512 Pain in left shoulder: Secondary | ICD-10-CM

## 2016-09-07 DIAGNOSIS — M545 Low back pain, unspecified: Secondary | ICD-10-CM

## 2016-09-17 ENCOUNTER — Other Ambulatory Visit: Payer: Self-pay | Admitting: Family Medicine

## 2016-09-17 DIAGNOSIS — G894 Chronic pain syndrome: Secondary | ICD-10-CM

## 2016-09-17 NOTE — Telephone Encounter (Signed)
Patient would like to speak with nurse or doctor in regards to being prescribed tylenol 3. Patient was read note from doctor. Patient does not understand why request has been denied.  Please follow up with patient. Contact information has been updated

## 2016-09-17 NOTE — Telephone Encounter (Signed)
Will route to PCP 

## 2016-09-17 NOTE — Telephone Encounter (Signed)
Patient will need OV to discuss

## 2016-09-19 ENCOUNTER — Other Ambulatory Visit: Payer: Self-pay | Admitting: Family Medicine

## 2016-09-19 DIAGNOSIS — M545 Low back pain: Principal | ICD-10-CM

## 2016-09-19 DIAGNOSIS — G8929 Other chronic pain: Secondary | ICD-10-CM

## 2016-09-19 MED ORDER — METHOCARBAMOL 750 MG PO TABS: 750.0000 mg | ORAL_TABLET | Freq: Three times a day (TID) | ORAL | 0 refills | Status: DC | PRN

## 2016-09-21 ENCOUNTER — Other Ambulatory Visit: Payer: Self-pay | Admitting: Family Medicine

## 2016-09-21 DIAGNOSIS — G8929 Other chronic pain: Secondary | ICD-10-CM

## 2016-09-21 DIAGNOSIS — M545 Low back pain: Principal | ICD-10-CM

## 2016-09-21 MED ORDER — METHOCARBAMOL 750 MG PO TABS: 750.0000 mg | ORAL_TABLET | Freq: Three times a day (TID) | ORAL | 0 refills | Status: AC | PRN

## 2016-09-22 NOTE — Telephone Encounter (Signed)
Pt was called and a VM was left informing pt to return phone call. 

## 2016-09-30 ENCOUNTER — Ambulatory Visit: Payer: Self-pay | Admitting: Family Medicine

## 2016-10-04 ENCOUNTER — Other Ambulatory Visit: Payer: Self-pay | Admitting: Family Medicine

## 2016-10-04 DIAGNOSIS — B356 Tinea cruris: Secondary | ICD-10-CM

## 2016-10-04 DIAGNOSIS — M25511 Pain in right shoulder: Secondary | ICD-10-CM

## 2016-10-04 DIAGNOSIS — F411 Generalized anxiety disorder: Secondary | ICD-10-CM

## 2016-10-04 DIAGNOSIS — F329 Major depressive disorder, single episode, unspecified: Secondary | ICD-10-CM

## 2016-10-04 DIAGNOSIS — M545 Low back pain, unspecified: Secondary | ICD-10-CM

## 2016-10-04 DIAGNOSIS — F32A Depression, unspecified: Secondary | ICD-10-CM

## 2016-10-04 DIAGNOSIS — M25512 Pain in left shoulder: Secondary | ICD-10-CM

## 2016-10-07 ENCOUNTER — Ambulatory Visit: Payer: Self-pay | Admitting: Family Medicine

## 2016-10-12 ENCOUNTER — Other Ambulatory Visit: Payer: Self-pay | Admitting: Family Medicine

## 2016-10-12 DIAGNOSIS — B356 Tinea cruris: Secondary | ICD-10-CM

## 2016-10-12 MED ORDER — KETOCONAZOLE 2 % EX CREA: 1.0000 "application " | TOPICAL_CREAM | Freq: Two times a day (BID) | CUTANEOUS | 2 refills | Status: AC

## 2016-10-21 ENCOUNTER — Ambulatory Visit: Payer: Self-pay | Admitting: Family Medicine

## 2017-01-10 ENCOUNTER — Other Ambulatory Visit: Payer: Self-pay | Admitting: Family Medicine

## 2017-02-02 ENCOUNTER — Encounter: Payer: Self-pay | Admitting: Family Medicine

## 2017-03-23 ENCOUNTER — Other Ambulatory Visit: Payer: Self-pay | Admitting: Family Medicine

## 2017-04-26 ENCOUNTER — Other Ambulatory Visit: Payer: Self-pay | Admitting: Family Medicine

## 2017-05-24 ENCOUNTER — Other Ambulatory Visit: Payer: Self-pay | Admitting: Family Medicine
